# Patient Record
Sex: Female | Born: 1980 | Race: Black or African American | Hispanic: No | Marital: Single | Smoking: Never smoker
Health system: Southern US, Community
[De-identification: ages and names within clinical notes are randomized; demographics above are authoritative.]

## PROBLEM LIST (undated history)

## (undated) DIAGNOSIS — Z789 Other specified health status: Secondary | ICD-10-CM

## (undated) HISTORY — PX: DILATION AND CURETTAGE OF UTERUS: SHX78

---
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
## 2011-12-11 ENCOUNTER — Encounter (HOSPITAL_COMMUNITY): Payer: Self-pay | Admitting: *Deleted

## 2011-12-11 ENCOUNTER — Inpatient Hospital Stay (HOSPITAL_COMMUNITY)
Admission: AD | Admit: 2011-12-11 | Discharge: 2011-12-12 | Disposition: A | Discharge status: Home / Self Care | Payer: 59 | Source: Ambulatory Visit | Attending: Obstetrics & Gynecology | Admitting: Obstetrics & Gynecology

## 2011-12-11 DIAGNOSIS — K5900 Constipation, unspecified: Secondary | ICD-10-CM | POA: Insufficient documentation

## 2011-12-11 DIAGNOSIS — R109 Unspecified abdominal pain: Secondary | ICD-10-CM | POA: Insufficient documentation

## 2011-12-11 HISTORY — DX: Other specified health status: Z78.9

## 2011-12-11 LAB — URINALYSIS, ROUTINE W REFLEX MICROSCOPIC
Bilirubin Urine: NEGATIVE
Glucose, UA: NEGATIVE mg/dL
Ketones, ur: NEGATIVE mg/dL
Protein, ur: NEGATIVE mg/dL

## 2011-12-11 LAB — URINE MICROSCOPIC-ADD ON

## 2011-12-11 LAB — CBC
MCV: 91.7 fL (ref 78.0–100.0)
Platelets: 176 10*3/uL (ref 150–400)
RBC: 3.96 MIL/uL (ref 3.87–5.11)
RDW: 12.8 % (ref 11.5–15.5)
WBC: 6 10*3/uL (ref 4.0–10.5)

## 2011-12-11 NOTE — MAU Provider Note (Signed)
History     CSN: [id number]  Arrival date and time: 12/11/11 2235   First Provider Initiated Contact with Patient 12/11/11 2329      Chief Complaint  Patient presents with  . Abdominal Pain   HPI Gloria Fernandez 31 y.o. LMP 11-18-11  Had sudden onset of right upper abdominal pain tonight which radiates to right side below umbilicus.  No nausea or vomiting.  No lower abdominal pain.  Pregnancy test is negative.  Reports last BM was 3 months ago.  States that is normal for her.  OB History    Grav Para Term Preterm Abortions TAB SAB Ect Mult Living   4 3 1 2 1  0 1 0 0 3      Past Medical History  Diagnosis Date  . No pertinent past medical history     Past Surgical History  Procedure Date  . Leep 2000  . Dilation and curettage of uterus     Family History  Problem Relation Age of Onset  . Anesthesia problems Neg Hx   . Hypotension Neg Hx   . Malignant hyperthermia Neg Hx   . Pseudochol deficiency Neg Hx     History  Substance Use Topics  . Smoking status: Never Smoker   . Smokeless tobacco: Not on file  . Alcohol Use: Yes     occasionally    Allergies: No Known Allergies  Prescriptions prior to admission  Medication Sig Dispense Refill  . naproxen sodium (ANAPROX) 220 MG tablet Take 440 mg by mouth as needed. Used for pain in stomach area.        Review of Systems  Gastrointestinal: Positive for abdominal pain and constipation. Negative for nausea, vomiting and diarrhea.  Genitourinary: Negative for dysuria.       No vaginal bleeding   Physical Exam   Blood pressure 114/77, pulse 75, temperature 98.3 F (36.8 C), temperature source Oral, resp. rate 20, height 5\' 4"  (1.626 m), weight 143 lb 12.8 oz (65.227 kg), last menstrual period 11/18/2011.  Physical Exam  Nursing note and vitals reviewed. Constitutional: She is oriented to person, place, and time. She appears well-developed and well-nourished.  HENT:  Head: Normocephalic.  Eyes: EOM are normal.    Neck: Neck supple.  GI: Soft. Bowel sounds are normal. She exhibits no distension. There is tenderness. There is no rebound and no guarding.       Pain in RUQ with palpation  Musculoskeletal: Normal range of motion.  Neurological: She is alert and oriented to person, place, and time.  Skin: Skin is warm and dry.  Psychiatric: She has a normal mood and affect.    MAU Course  Procedures Results for orders placed during the hospital encounter of 12/11/11 (from the past 24 hour(s))  URINALYSIS, ROUTINE W REFLEX MICROSCOPIC     Status: Abnormal   Collection Time   12/11/11 10:50 PM      Component Value Range   Color, Urine YELLOW  YELLOW    APPearance CLEAR  CLEAR    Specific Gravity, Urine 1.010  1.005 - 1.030    pH 7.5  5.0 - 8.0    Glucose, UA NEGATIVE  NEGATIVE (mg/dL)   Hgb urine dipstick NEGATIVE  NEGATIVE    Bilirubin Urine NEGATIVE  NEGATIVE    Ketones, ur NEGATIVE  NEGATIVE (mg/dL)   Protein, ur NEGATIVE  NEGATIVE (mg/dL)   Urobilinogen, UA 1.0  0.0 - 1.0 (mg/dL)   Nitrite NEGATIVE  NEGATIVE    Leukocytes, UA  SMALL (*) NEGATIVE   URINE MICROSCOPIC-ADD ON     Status: Abnormal   Collection Time   12/11/11 10:50 PM      Component Value Range   Squamous Epithelial / LPF FEW (*) RARE    WBC, UA 3-6  <3 (WBC/hpf)   RBC / HPF 0-2  <3 (RBC/hpf)   Bacteria, UA FEW (*) RARE    Urine-Other MUCOUS PRESENT    POCT PREGNANCY, URINE     Status: Normal   Collection Time   12/11/11 10:56 PM      Component Value Range   Preg Test, Ur NEGATIVE  NEGATIVE   CBC     Status: Normal   Collection Time   12/11/11 11:45 PM      Component Value Range   WBC 6.0  4.0 - 10.5 (K/uL)   RBC 3.96  3.87 - 5.11 (MIL/uL)   Hemoglobin 12.2  12.0 - 15.0 (g/dL)   HCT 16.1  09.6 - 04.5 (%)   MCV 91.7  78.0 - 100.0 (fL)   MCH 30.8  26.0 - 34.0 (pg)   MCHC 33.6  30.0 - 36.0 (g/dL)   RDW 40.9  81.1 - 91.4 (%)   Platelets 176  150 - 400 (K/uL)  COMPREHENSIVE METABOLIC PANEL     Status: Normal   Collection  Time   12/11/11 11:45 PM      Component Value Range   Sodium 136  135 - 145 (mEq/L)   Potassium 3.5  3.5 - 5.1 (mEq/L)   Chloride 103  96 - 112 (mEq/L)   CO2 25  19 - 32 (mEq/L)   Glucose, Bld 88  70 - 99 (mg/dL)   BUN 10  6 - 23 (mg/dL)   Creatinine, Ser 7.82  0.50 - 1.10 (mg/dL)   Calcium 8.7  8.4 - 95.6 (mg/dL)   Total Protein 6.8  6.0 - 8.3 (g/dL)   Albumin 3.6  3.5 - 5.2 (g/dL)   AST 21  0 - 37 (U/L)   ALT 13  0 - 35 (U/L)   Alkaline Phosphatase 65  39 - 117 (U/L)   Total Bilirubin 0.6  0.3 - 1.2 (mg/dL)   GFR calc non Af Amer >90  >90 (mL/min)   GFR calc Af Amer >90  >90 (mL/min)    MDM With rest in bed, pain has decreased.  Discussed at length with client the importance of having a BM at least twice a week minimum.  She continues to affirm that she has not had a BM in 3 months.  Assessment and Plan  Constipation Abdominal pain  Plan Drink at least 8 8-oz glasses of water every day. Take over the counter Miralax by the package directions. Eat a high fiber diet. No fried foods over the weekend. Follow up with your doctor if the pain continues so you can be evaluated further.  Lumen Brinlee 12/11/2011, 11:34 PM

## 2011-12-11 NOTE — MAU Note (Signed)
"  I was sitting at my computer and started having sharp pain in R side. Constant pain. Then I got real hot and sat in front of a fan. Sometimes I hurt like this when I eat certain foods. It's never been this bad." Ate hamburger and rice at [facility]

## 2011-12-12 LAB — COMPREHENSIVE METABOLIC PANEL
ALT: 13 U/L (ref 0–35)
AST: 21 U/L (ref 0–37)
Albumin: 3.6 g/dL (ref 3.5–5.2)
Alkaline Phosphatase: 65 U/L (ref 39–117)
CO2: 25 mEq/L (ref 19–32)
Chloride: 103 mEq/L (ref 96–112)
GFR calc non Af Amer: >90 mL/min (ref >90)
Potassium: 3.5 mEq/L (ref 3.5–5.1)
Sodium: 136 mEq/L (ref 135–145)
Total Bilirubin: 0.6 mg/dL (ref 0.3–1.2)

## 2011-12-12 MED ORDER — IBUPROFEN 600 MG PO TABS
600.0000 mg | ORAL_TABLET | Freq: Once | ORAL | Status: AC
  Administered 2011-12-12: 600 mg via ORAL
  Filled 2011-12-12: qty 1

## 2011-12-12 NOTE — Progress Notes (Signed)
Written and verbal d/c instructions given and understanding voiced. 

## 2011-12-12 NOTE — Discharge Instructions (Signed)
Drink at least 8 8-oz glasses of water every day. Take over the counter Miralax by the package directions. Eat a high fiber diet. No fried foods over the weekend. Follow up with your doctor if the pain continues so you can be evaluated further.

## 2012-07-12 ENCOUNTER — Other Ambulatory Visit: Payer: Self-pay | Admitting: Obstetrics

## 2012-07-12 DIAGNOSIS — N644 Mastodynia: Secondary | ICD-10-CM

## 2012-07-19 ENCOUNTER — Other Ambulatory Visit: Payer: Self-pay | Admitting: Obstetrics

## 2012-07-19 ENCOUNTER — Ambulatory Visit
Admission: RE | Admit: 2012-07-19 | Discharge: 2012-07-19 | Disposition: A | Discharge status: Home / Self Care | Payer: 59 | Source: Ambulatory Visit | Attending: Obstetrics | Admitting: Obstetrics

## 2012-07-19 DIAGNOSIS — N644 Mastodynia: Secondary | ICD-10-CM

## 2012-07-19 IMAGING — MG MM DIGITAL DIAGNOSTIC BILAT
6 series · 6 of 6 positions shown · non-contrast
Comparison: No previous examinations.

CLINICAL DATA: Focal tenderness within the upper-outer quadrant
left breast.

DIGITAL DIAGNOSTIC BILATERAL MAMMOGRAM WITH CAD AND LEFT BREAST
ULTRASOUND:

[R CC (1 of 2)]
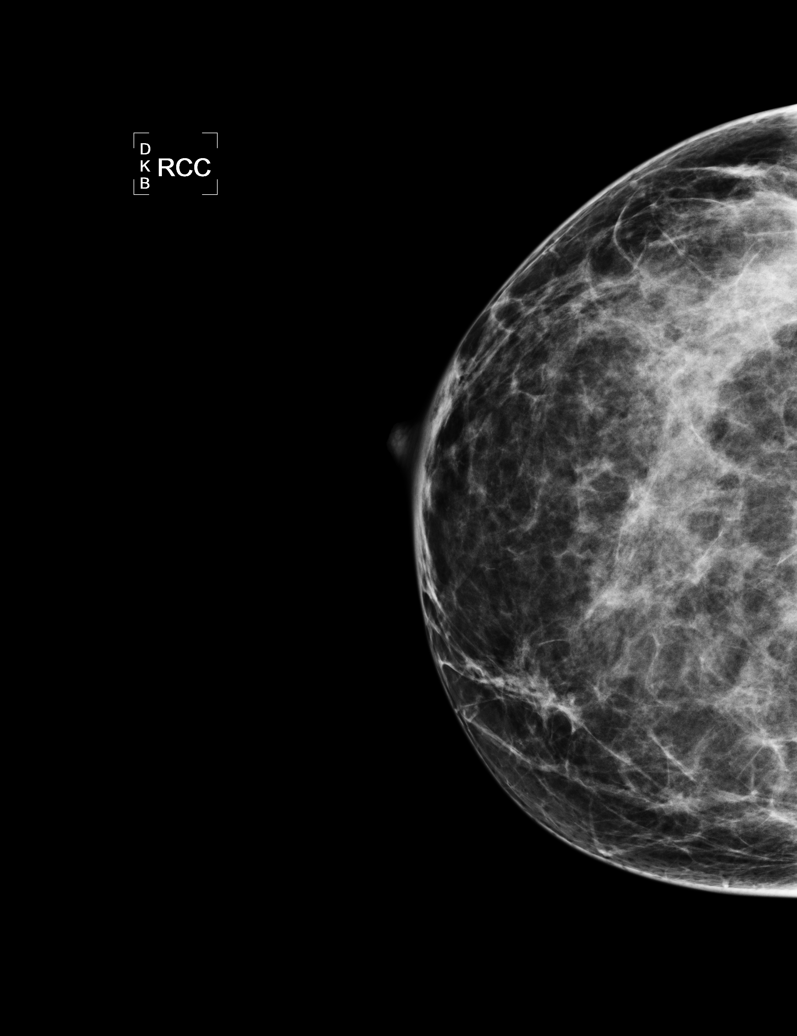

[L CC]
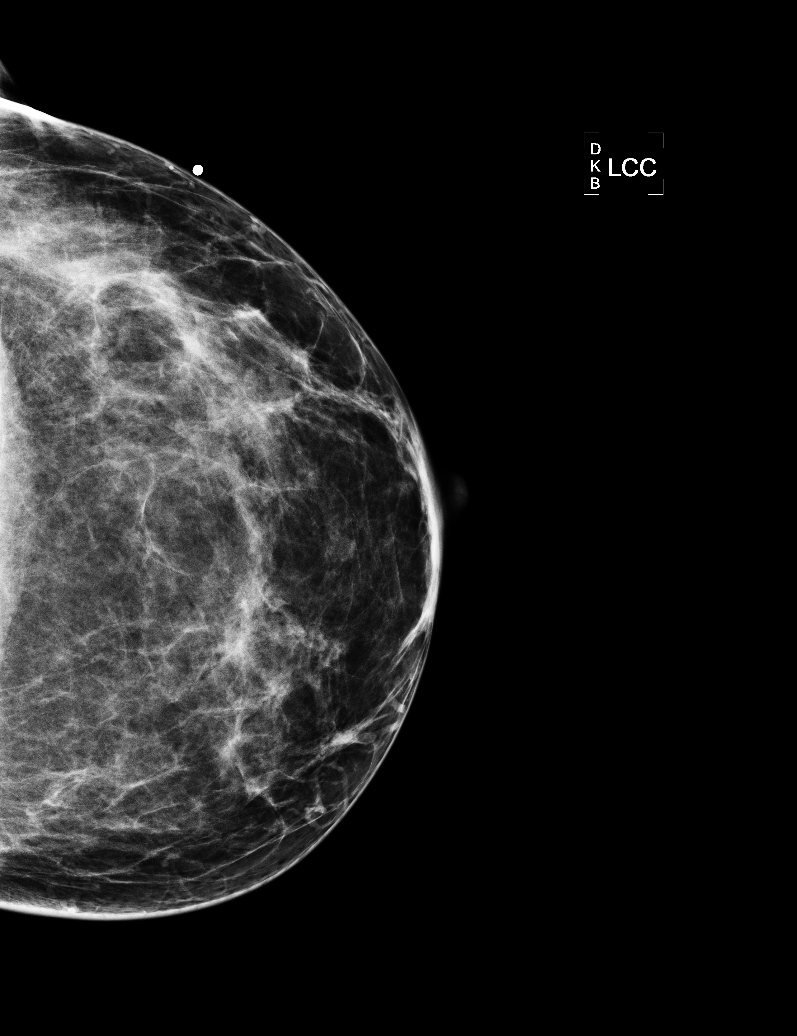

[L MLO]
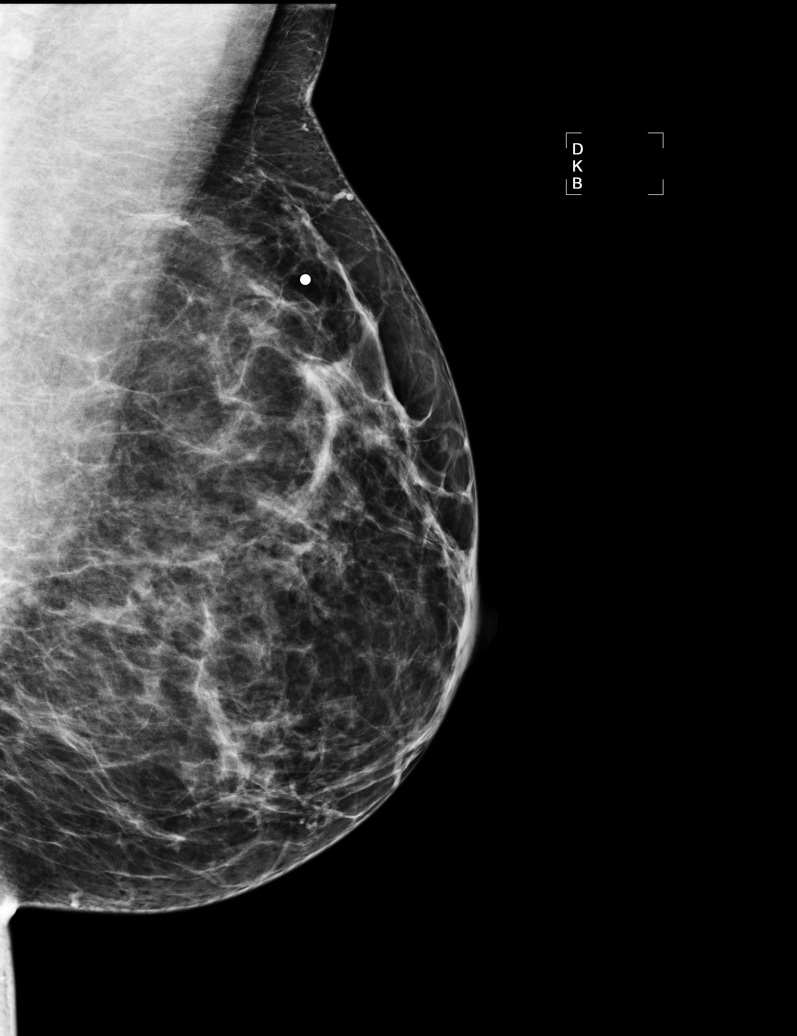

[R MLO]
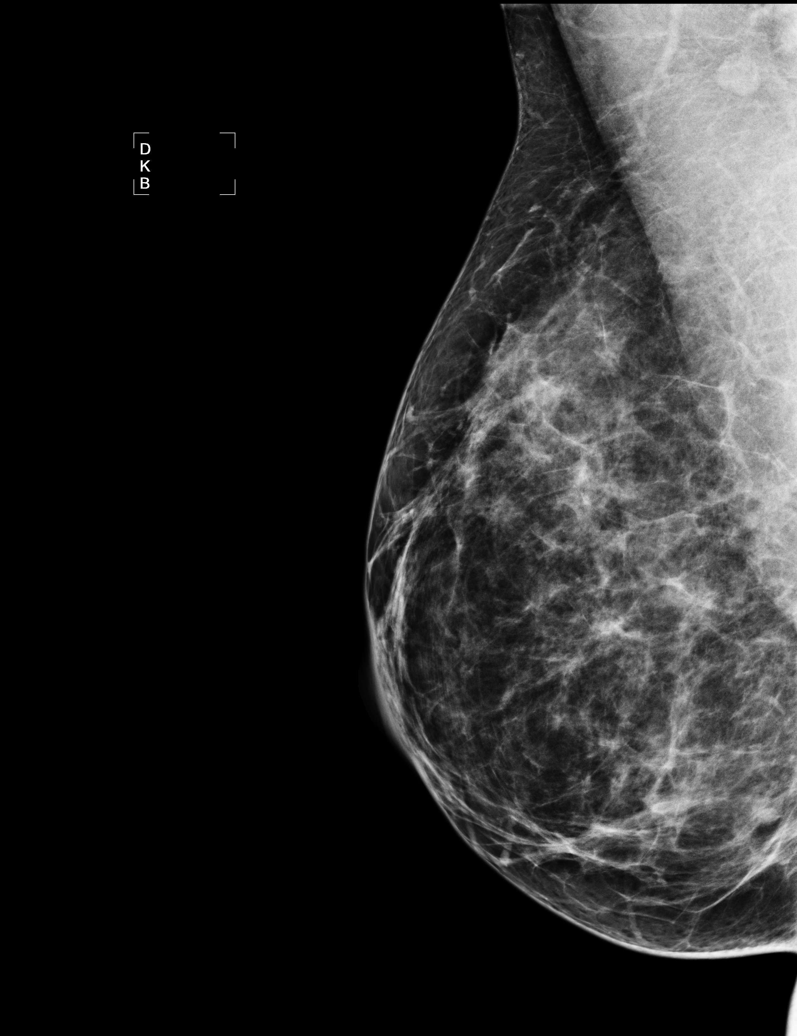

[L TAN]
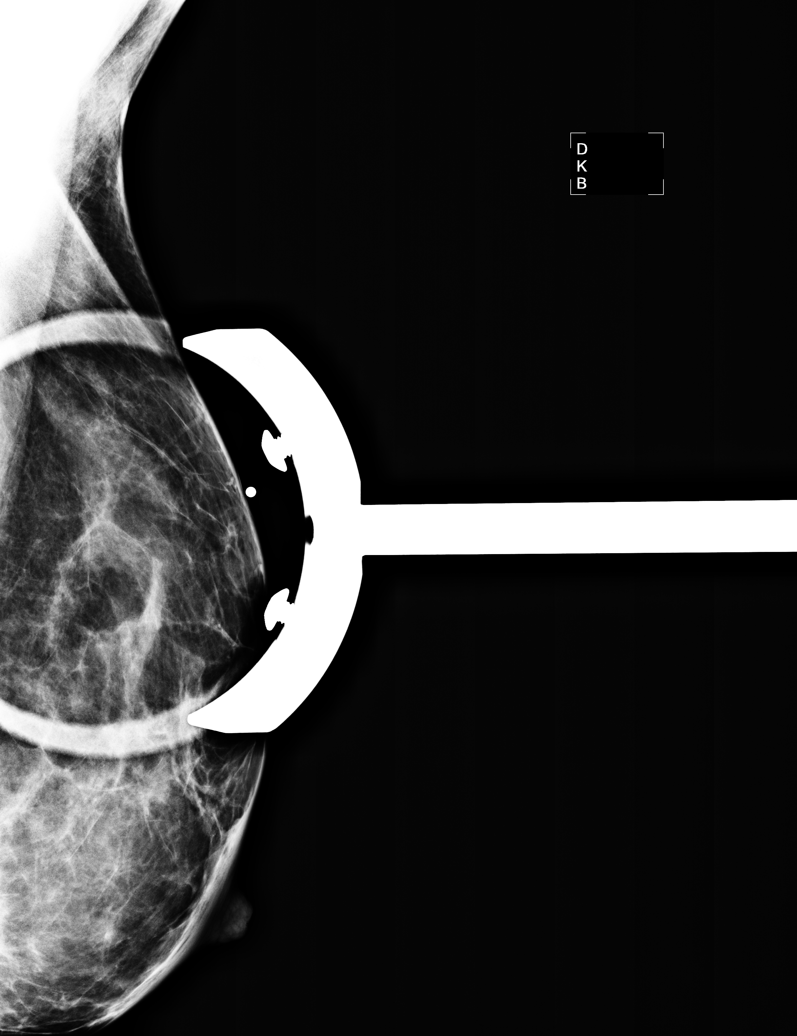

[R CC (2 of 2)]
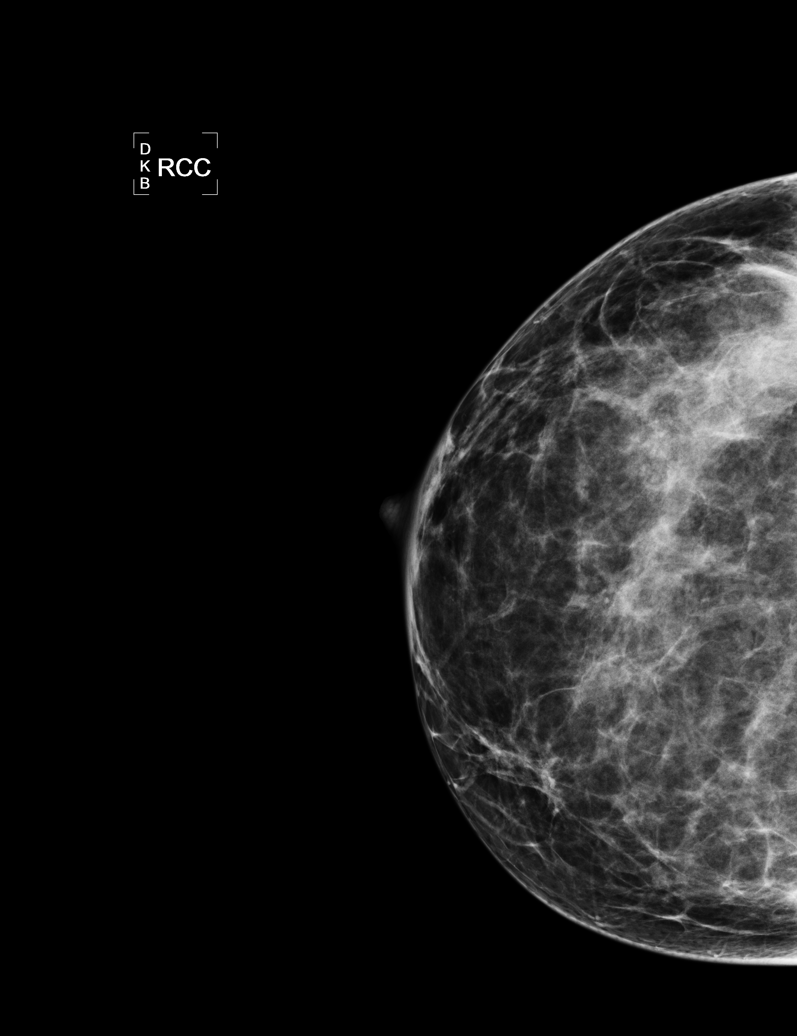

[6 of 6 positions shown; findings below may reference images not displayed]

FINDINGS: Breast Density: 3: The breast tissue is heterogeneously dense..

There is no mass, distortion, or worrisome calcification within
either breast.  In the area patient's focal tenderness there is
normal-appearing fibroglandular tissue.

Mammographic images were processed with CAD.

On physical exam, there is no discrete palpable abnormality within
the upper-outer quadrant of the left breast.

Ultrasound is performed, showing normal-appearing fibroglandular
tissue.
IMPRESSION: No findings worrisome for malignancy.  Breast self-examination was
reviewed with the patient.

RECOMMENDATION:
Screening mammography at age 39.

I have discussed the findings and recommendations with the patient.
Results were also provided in writing at the conclusion of the
visit.

BI-RADS CATEGORY 1:  Negative.

## 2013-02-20 ENCOUNTER — Ambulatory Visit: Payer: Self-pay | Admitting: Obstetrics

## 2013-04-28 ENCOUNTER — Ambulatory Visit (INDEPENDENT_AMBULATORY_CARE_PROVIDER_SITE_OTHER): Payer: Managed Care, Other (non HMO) | Admitting: Advanced Practice Midwife

## 2013-04-28 ENCOUNTER — Encounter: Payer: Self-pay | Admitting: Advanced Practice Midwife

## 2013-04-28 VITALS — BP 121/76 | HR 71 | Temp 98.3°F | Ht 65.0 in | Wt 141.2 lb

## 2013-04-28 DIAGNOSIS — Z01419 Encounter for gynecological examination (general) (routine) without abnormal findings: Secondary | ICD-10-CM | POA: Insufficient documentation

## 2013-04-28 DIAGNOSIS — Z8249 Family history of ischemic heart disease and other diseases of the circulatory system: Secondary | ICD-10-CM | POA: Insufficient documentation

## 2013-04-28 DIAGNOSIS — Z8342 Family history of familial hypercholesterolemia: Secondary | ICD-10-CM

## 2013-04-28 DIAGNOSIS — Z87898 Personal history of other specified conditions: Secondary | ICD-10-CM

## 2013-04-28 LAB — CBC WITH DIFFERENTIAL/PLATELET
Basophils Relative: 1 % (ref 0–1)
Eosinophils Absolute: 0.1 10*3/uL (ref 0.0–0.7)
Eosinophils Relative: 2 % (ref 0–5)
Lymphs Abs: 1.5 10*3/uL (ref 0.7–4.0)
MCH: 24.8 pg — ABNORMAL LOW (ref 26.0–34.0)
MCHC: 31.9 g/dL (ref 30.0–36.0)
MCV: 77.8 fL — ABNORMAL LOW (ref 78.0–100.0)
Monocytes Relative: 7 % (ref 3–12)
Neutrophils Relative %: 52 % (ref 43–77)
Platelets: 290 10*3/uL (ref 150–400)

## 2013-04-28 LAB — COMPREHENSIVE METABOLIC PANEL
Alkaline Phosphatase: 66 U/L (ref 39–117)
CO2: 27 mEq/L (ref 19–32)
Creat: 0.72 mg/dL (ref 0.50–1.10)
Glucose, Bld: 80 mg/dL (ref 70–99)
Sodium: 137 mEq/L (ref 135–145)
Total Bilirubin: 0.7 mg/dL (ref 0.3–1.2)
Total Protein: 7.5 g/dL (ref 6.0–8.3)

## 2013-04-28 LAB — CHOLESTEROL, TOTAL: Cholesterol: 144 mg/dL (ref 0–200)

## 2013-04-28 LAB — HDL CHOLESTEROL: HDL: 54 mg/dL (ref >39)

## 2013-04-28 LAB — TSH: TSH: 1.011 u[IU]/mL (ref 0.350–4.500)

## 2013-04-28 LAB — HEPATITIS B SURFACE ANTIGEN: Hepatitis B Surface Ag: NEGATIVE

## 2013-04-28 LAB — HIV ANTIBODY (ROUTINE TESTING W REFLEX): HIV: NONREACTIVE

## 2013-04-28 NOTE — Progress Notes (Signed)
. Subjective:     Gloria Fernandez is a 32 y.o. female here for a routine exam.  No current complaints.  Personal health questionnaire reviewed: yes. Patient having break through spoting. Denies other symptoms. Declines contraception for control of breakthrough bleeding.   Patient remains monogamous but is unsure if her partner is, would like STI screening.    Gynecologic History Patient's last menstrual period was 04/20/2013. Contraception: none Last  11/2011. Results were: normal.  Hx of LEEP. Last mammogram: 2013. Results were: normal  Obstetric History OB History  Gravida Para Term Preterm AB SAB TAB Ectopic Multiple Living  4 3 1 2 1 1  0 0 0 3    # Outcome Date GA Lbr Len/2nd Weight Sex Delivery Anes PTL Lv  4 SAB           3 PRE      SVD     2 PRE      SVD     1 TRM      SVD          The following portions of the patient's history were reviewed and updated as appropriate: allergies, current medications, past family history, past medical history, past social history, past surgical history and problem list.  Review of Systems A comprehensive review of systems was negative.    Objective:    BP 121/76  Pulse 71  Temp(Src) 98.3 F (36.8 C) (Oral)  Ht 5\' 5"  (1.651 m)  Wt 141 lb 3.2 oz (64.048 kg)  BMI 23.5 kg/m2  LMP 04/20/2013  General Appearance:    Alert, cooperative, no distress, appears stated age  Head:    Normocephalic, without obvious abnormality, atraumatic  Eyes:    PERRL, conjunctiva/corneas clear, EOM's intact, fundi    benign, both eyes  Ears:    Normal TM's and external ear canals, both ears  Nose:   Nares normal, septum midline, mucosa normal, no drainage    or sinus tenderness  Throat:   Lips, mucosa, and tongue normal; teeth and gums normal  Neck:   Supple, symmetrical, trachea midline, no adenopathy;    thyroid:  no enlargement/tenderness/nodules; no carotid   bruit or JVD  Back:     Symmetric, no curvature, ROM normal, no CVA tenderness  Lungs:      Clear to auscultation bilaterally, respirations unlabored  Chest Wall:    No tenderness or deformity   Heart:    Regular rate and rhythm, S1 and S2 normal, no murmur, rub   or gallop  Breast Exam:    No tenderness, masses, or nipple abnormality  Abdomen:     Soft, non-tender, bowel sounds active all four quadrants,    no masses, no organomegaly  Genitalia:    Normal female without lesion, discharge or tenderness     Extremities:   Extremities normal, atraumatic, no cyanosis or edema  Pulses:   2+ and symmetric all extremities  Skin:   Skin color, texture, turgor normal, no rashes or lesions  Lymph nodes:   Cervical, supraclavicular, and axillary nodes normal  Neurologic:   CNII-XII intact, normal strength, sensation and reflexes    throughout      Assessment:    Healthy female exam.  Patient Active Problem List   Diagnosis Date Noted  . Well woman exam with routine gynecological exam 04/28/2013  . H/O abnormal Pap smear H/O LEEP 04/28/2013  . Family history of cardiovascular disease 04/28/2013      Plan:    Education reviewed: calcium  supplements, depression evaluation, low fat, low cholesterol diet, safe sex/STD prevention, self breast exams, skin cancer screening and weight bearing exercise. Contraception: none. Follow up in: PRN .Marland Kitchen   Discussed with patient that since she is not preventing pregnancy she should be taking a PNV or folic acid supplement. Wellness lab panel today, if WNL, repeat in 5 years.   40 min spent with patient greater than 80% spent in counseling and coordination of care.   Amy Wilson Singer CNM

## 2013-05-01 LAB — PAP IG W/ RFLX HPV ASCU

## 2013-05-21 ENCOUNTER — Telehealth: Payer: Self-pay | Admitting: *Deleted

## 2013-05-21 NOTE — Telephone Encounter (Signed)
Call placed to patient no answer. A detailed voice message was left informing patient per Amy instructions. A voice message was left advising patient to call office if any questions. 

## 2013-05-21 NOTE — Telephone Encounter (Signed)
Message copied by Glendell Docker on Sun May 21, 2013 12:27 PM ------      Message from: Fowler, Virginia H      Created: Tue May 02, 2013  5:40 PM       Please call patient and notify of mild anemia. Patient can increase dietary sources of iron, oatmeal, beans, redmeats, green leafy vegetables. She may also take FeSO4 325 mg daily.             Amy Wilson Singer CNM      ----- Message -----         From: [facility]         Sent: 04/28/2013  10:10 PM           To: Amy Dessa Phi, CNM                   ------

## 2013-09-06 ENCOUNTER — Other Ambulatory Visit: Payer: Self-pay | Admitting: *Deleted

## 2013-09-06 DIAGNOSIS — Z309 Encounter for contraceptive management, unspecified: Secondary | ICD-10-CM

## 2013-09-06 MED ORDER — MEDROXYPROGESTERONE ACETATE 150 MG/ML IM SUSP: 150.0000 mg | INTRAMUSCULAR | Status: DC

## 2013-09-07 ENCOUNTER — Ambulatory Visit: Payer: Managed Care, Other (non HMO)

## 2013-09-08 ENCOUNTER — Ambulatory Visit (INDEPENDENT_AMBULATORY_CARE_PROVIDER_SITE_OTHER): Payer: BC Managed Care – PPO | Admitting: *Deleted

## 2013-09-08 VITALS — BP 116/78 | HR 81 | Temp 98.2°F | Ht 65.0 in | Wt 134.0 lb

## 2013-09-08 DIAGNOSIS — Z3049 Encounter for surveillance of other contraceptives: Secondary | ICD-10-CM

## 2013-09-08 MED ORDER — MEDROXYPROGESTERONE ACETATE 150 MG/ML IM SUSP
150.0000 mg | Freq: Once | INTRAMUSCULAR | Status: AC
  Administered 2013-09-08: 150 mg via INTRAMUSCULAR

## 2013-09-08 NOTE — Progress Notes (Signed)
Patient in office today for depo shot.

## 2013-09-29 ENCOUNTER — Encounter: Payer: Self-pay | Admitting: Obstetrics

## 2013-11-16 ENCOUNTER — Other Ambulatory Visit: Payer: Self-pay | Admitting: Advanced Practice Midwife

## 2013-11-16 ENCOUNTER — Encounter: Payer: Self-pay | Admitting: Advanced Practice Midwife

## 2013-11-30 ENCOUNTER — Ambulatory Visit (INDEPENDENT_AMBULATORY_CARE_PROVIDER_SITE_OTHER): Payer: BC Managed Care – PPO | Admitting: *Deleted

## 2013-11-30 VITALS — BP 123/80 | HR 87 | Temp 98.4°F | Ht 65.0 in | Wt 139.0 lb

## 2013-11-30 DIAGNOSIS — Z309 Encounter for contraceptive management, unspecified: Secondary | ICD-10-CM

## 2013-11-30 DIAGNOSIS — IMO0001 Reserved for inherently not codable concepts without codable children: Secondary | ICD-10-CM

## 2013-11-30 MED ORDER — MEDROXYPROGESTERONE ACETATE 150 MG/ML IM SUSP
150.0000 mg | INTRAMUSCULAR | Status: AC
  Administered 2013-11-30 – 2014-08-09 (×4): 150 mg via INTRAMUSCULAR

## 2013-11-30 NOTE — Progress Notes (Signed)
Patient is in the office today for her DEPO Injection. Patient is on time for her Injection. Patient states she is not having any problems with her DEPO. Injection given in Right Upper Outer Quadrant. Patient tolerated well. Patient advised that if she started to have any problems to call the office. Patient notified to return to the office on February 21, 2014 for her next DEPO Injection.

## 2014-02-21 ENCOUNTER — Ambulatory Visit (INDEPENDENT_AMBULATORY_CARE_PROVIDER_SITE_OTHER): Payer: BC Managed Care – PPO | Admitting: *Deleted

## 2014-02-21 VITALS — BP 124/80 | HR 71 | Temp 98.3°F | Ht 65.0 in | Wt 152.0 lb

## 2014-02-21 DIAGNOSIS — Z3042 Encounter for surveillance of injectable contraceptive: Secondary | ICD-10-CM

## 2014-02-21 DIAGNOSIS — Z3049 Encounter for surveillance of other contraceptives: Secondary | ICD-10-CM

## 2014-02-21 NOTE — Progress Notes (Signed)
Patient is in the office today for her DEPO Injection. Patient is on time for her Injection. Patient denies any concerns today. Injection given in Left Upper Outer Quadrant. Patient tolerated well. Patient advised that not having a cycle is common and that she will hopefully not have anymore cycles while on the DEPO. Patient also advised that we have noticed an increase in weight gain over the past year and that she should just be mindful of when she eats and what she eats because the hormones in the DEPO can cause you to be hungry, so she just wants to be mindful and eat healthy foods. Patient voiced understanding. Patient advised to come back on May 15, 2014 for her next DEPO Injection  BP 124/80  Pulse 71  Temp(Src) 98.3 F (36.8 C)  Ht 5\' 5"  (1.651 m)  Wt 152 lb (68.947 kg)  BMI 25.29 kg/m2  Administrations This Visit   medroxyPROGESTERone (DEPO-PROVERA) injection 150 mg   Administered Action Dose Route Administered By   02/21/2014 Given 150 mg Intramuscular Odessa FlemingKristina M Daveion Robar, LPN

## 2014-05-01 ENCOUNTER — Ambulatory Visit (INDEPENDENT_AMBULATORY_CARE_PROVIDER_SITE_OTHER): Payer: BC Managed Care – PPO | Admitting: Obstetrics

## 2014-05-01 ENCOUNTER — Other Ambulatory Visit: Payer: Self-pay | Admitting: Obstetrics

## 2014-05-01 ENCOUNTER — Ambulatory Visit: Payer: BC Managed Care – PPO | Admitting: Advanced Practice Midwife

## 2014-05-01 ENCOUNTER — Encounter: Payer: Self-pay | Admitting: Obstetrics

## 2014-05-01 VITALS — BP 129/86 | HR 60 | Temp 99.0°F | Ht 65.0 in | Wt 150.0 lb

## 2014-05-01 DIAGNOSIS — Z01419 Encounter for gynecological examination (general) (routine) without abnormal findings: Secondary | ICD-10-CM

## 2014-05-01 DIAGNOSIS — Z113 Encounter for screening for infections with a predominantly sexual mode of transmission: Secondary | ICD-10-CM

## 2014-05-01 DIAGNOSIS — N760 Acute vaginitis: Secondary | ICD-10-CM

## 2014-05-01 MED ORDER — METRONIDAZOLE 500 MG PO TABS: 500.0000 mg | ORAL_TABLET | Freq: Two times a day (BID) | ORAL | Status: DC

## 2014-05-01 NOTE — Progress Notes (Signed)
Subjective:     Gloria Fernandez is a 33 y.o. female here for a routine exam.  Current complaints: Vaginal discharge, malodorous.    Personal health questionnaire:  Is patient Ashkenazi Jewish, have a family history of breast and/or ovarian cancer: no Is there a family history of uterine cancer diagnosed at age < 17, gastrointestinal cancer, urinary tract cancer, family member who is a Personnel officer syndrome-associated carrier: no Is the patient overweight and hypertensive, family history of diabetes, personal history of gestational diabetes or PCOS: no Is patient over 37, have PCOS,  family history of premature CHD under age 36, diabetes, smoke, have hypertension or peripheral artery disease:  no At any time, has a partner hit, kicked or otherwise hurt or frightened you?: no Over the past 2 weeks, have you felt down, depressed or hopeless?: no Over the past 2 weeks, have you felt little interest or pleasure in doing things?:no   Gynecologic History No LMP recorded. Patient has had an injection. Contraception: Depo-Provera injections Last Pap: 2014. Results were: normal Last mammogram: n/a. Results were: n/a  Obstetric History OB History  Gravida Para Term Preterm AB SAB TAB Ectopic Multiple Living  0 0 0 3    # Outcome Date GA Lbr Len/2nd Weight Sex Delivery Anes PTL Lv  4 SAB 2013        N  3 TRM 02/15/06 [redacted]w[redacted]d  7 lb (3.175 kg) M SVD None  Y  2 PRE 04/10/98 [redacted]w[redacted]d  5 lb 4 oz (2.381 kg) F SVD None  Y  1 TRM 04/17/97 [redacted]w[redacted]d  6 lb 6 oz (2.892 kg) M SVD None  Y      Past Medical History  Diagnosis Date  . No pertinent past medical history     Past Surgical History  Procedure Laterality Date  . Leep  2000  . Dilation and curettage of uterus      Current outpatient prescriptions:medroxyPROGESTERone (DEPO-PROVERA) 150 MG/ML injection, Inject 1 mL (150 mg total) into the muscle every 3 (three) months., Disp: 1 mL, Rfl: 3;  metroNIDAZOLE (FLAGYL) 500 MG tablet, Take 1 tablet (500 mg  total) by mouth 2 (two) times daily., Disp: 14 tablet, Rfl: 2 Current facility-administered medications:medroxyPROGESTERone (DEPO-PROVERA) injection 150 mg, 150 mg, Intramuscular, Q90 days, Brock Bad, MD, 150 mg at 02/21/14 1624 No Known Allergies  History  Substance Use Topics  . Smoking status: Never Smoker   . Smokeless tobacco: Not on file  . Alcohol Use: Yes     Comment: Occass.     Family History  Problem Relation Age of Onset  . Anesthesia problems Neg Hx   . Hypotension Neg Hx   . Malignant hyperthermia Neg Hx   . Pseudochol deficiency Neg Hx       Review of Systems  Constitutional: negative for fatigue and weight loss Respiratory: negative for cough and wheezing Cardiovascular: negative for chest pain, fatigue and palpitations Gastrointestinal: negative for abdominal pain and change in bowel habits Musculoskeletal:negative for myalgias Neurological: negative for gait problems and tremors Behavioral/Psych: negative for abusive relationship, depression Endocrine: negative for temperature intolerance   Genitourinary:negative for abnormal menstrual periods, genital lesions, hot flashes, sexual problems and vaginal discharge Integument/breast: negative for breast lump, breast tenderness, nipple discharge and skin lesion(s)    Objective:       BP 129/86  Pulse 60  Temp(Src) 99 F (37.2 C)  Ht  (1.651 m)  Wt 150 lb (68.04 kg)  BMI  24.96 kg/m2 General:   alert  Skin:   no rash or abnormalities  Lungs:   clear to auscultation bilaterally  Heart:   regular rate and rhythm, S1, S2 normal, no murmur, click, rub or gallop  Breasts:   normal without suspicious masses, skin or nipple changes or axillary nodes  Abdomen:  normal findings: no organomegaly, soft, non-tender and no hernia  Pelvis:  External genitalia: normal general appearance Urinary system: urethral meatus normal and bladder without fullness, nontender Vaginal: vaginal discharge,  malodorous Cervix: normal appearance Adnexa: normal bimanual exam Uterus: anteverted and non-tender, normal size   Lab Review Urine pregnancy test Labs reviewed yes Radiologic studies reviewed no    Assessment:    Healthy female exam.   BV  Surveillance of Depo Provera.  Doing well.   Plan:    Education reviewed: low fat, low cholesterol diet, safe sex/STD prevention, self breast exams, weight bearing exercise and prevention and management of BV. Contraception: Depo-Provera injections. Follow up in: 1 year.   Meds ordered this encounter  Medications  . metroNIDAZOLE (FLAGYL) 500 MG tablet    Sig: Take 1 tablet (500 mg total) by mouth 2 (two) times daily.    Dispense:  14 tablet    Refill:  2   Orders Placed This Encounter  Procedures  . WET PREP BY MOLECULAR PROBE  . GC/Chlamydia Probe Amp  . HIV antibody  . Hepatitis B surface antigen  . RPR  . Hepatitis C antibody

## 2014-05-02 ENCOUNTER — Other Ambulatory Visit: Payer: Self-pay | Admitting: Obstetrics

## 2014-05-02 LAB — WET PREP BY MOLECULAR PROBE
Candida species: NEGATIVE
Gardnerella vaginalis: POSITIVE — AB
Trichomonas vaginosis: NEGATIVE

## 2014-05-02 LAB — GC/CHLAMYDIA PROBE AMP
CT Probe RNA: NEGATIVE
GC Probe RNA: NEGATIVE

## 2014-05-02 LAB — HEPATITIS C ANTIBODY: HCV Ab: NEGATIVE

## 2014-05-02 LAB — RPR

## 2014-05-02 LAB — HEPATITIS B SURFACE ANTIGEN: Hepatitis B Surface Ag: NEGATIVE

## 2014-05-02 LAB — HIV ANTIBODY (ROUTINE TESTING W REFLEX): HIV 1&2 Ab, 4th Generation: NONREACTIVE

## 2014-05-03 LAB — PAP IG AND HPV HIGH-RISK: HPV DNA HIGH RISK: NOT DETECTED

## 2014-05-15 ENCOUNTER — Ambulatory Visit: Payer: BC Managed Care – PPO | Admitting: *Deleted

## 2014-05-15 ENCOUNTER — Ambulatory Visit (INDEPENDENT_AMBULATORY_CARE_PROVIDER_SITE_OTHER): Payer: BC Managed Care – PPO | Admitting: *Deleted

## 2014-05-15 VITALS — BP 135/92 | HR 61 | Temp 98.3°F | Ht 65.0 in | Wt 152.0 lb

## 2014-05-15 DIAGNOSIS — Z3042 Encounter for surveillance of injectable contraceptive: Secondary | ICD-10-CM

## 2014-05-15 NOTE — Progress Notes (Signed)
Patient in office for a Depo Injection. Patient is on time for her injection.  Patient tolerated injection well.  Patient due for her next injection 08-06-14.   BP 135/92  Pulse 61  Temp(Src) 98.3 F (36.8 C)  Ht 5\' 5"  (1.651 m)  Wt 152 lb (68.947 kg)  BMI 25.29 kg/m2  Administrations This Visit   medroxyPROGESTERone (DEPO-PROVERA) injection 150 mg   Administered Action Dose Route Administered By   05/15/2014 Given 150 mg Intramuscular Shelda PalAndrea Lynn Schuyler Behan, LPN

## 2014-08-02 ENCOUNTER — Other Ambulatory Visit: Payer: Self-pay | Admitting: Obstetrics

## 2014-08-06 ENCOUNTER — Other Ambulatory Visit: Payer: Self-pay | Admitting: *Deleted

## 2014-08-06 ENCOUNTER — Ambulatory Visit: Payer: BC Managed Care – PPO

## 2014-08-06 ENCOUNTER — Other Ambulatory Visit: Payer: Self-pay | Admitting: Obstetrics

## 2014-08-09 ENCOUNTER — Ambulatory Visit (INDEPENDENT_AMBULATORY_CARE_PROVIDER_SITE_OTHER): Payer: BC Managed Care – PPO | Admitting: *Deleted

## 2014-08-09 VITALS — BP 136/91 | HR 78 | Temp 98.7°F | Ht 65.0 in | Wt 163.0 lb

## 2014-08-09 DIAGNOSIS — Z3042 Encounter for surveillance of injectable contraceptive: Secondary | ICD-10-CM

## 2014-08-09 NOTE — Progress Notes (Signed)
Patient in office for Depo Injection. Patient is on time for her injection.  Patient due for her next injection. 10-31-14.  BP 136/91 mmHg  Pulse 78  Temp(Src) 98.7 F (37.1 C)  Ht 5\' 5"  (1.651 m)  Wt 163 lb (73.936 kg)  BMI 27.12 kg/m2  Administrations This Visit    medroxyPROGESTERone (DEPO-PROVERA) injection 150 mg    Administered Action Dose Route Administered By         08/09/2014 Given 150 mg Intramuscular Shelda PalAndrea Lynn Tylia Ewell, LPN

## 2014-10-31 ENCOUNTER — Ambulatory Visit (INDEPENDENT_AMBULATORY_CARE_PROVIDER_SITE_OTHER): Payer: BC Managed Care – PPO | Admitting: *Deleted

## 2014-10-31 VITALS — BP 134/87 | HR 65 | Temp 99.0°F | Ht 65.0 in | Wt 166.0 lb

## 2014-10-31 DIAGNOSIS — Z3042 Encounter for surveillance of injectable contraceptive: Secondary | ICD-10-CM

## 2014-10-31 MED ORDER — MEDROXYPROGESTERONE ACETATE 150 MG/ML IM SUSP
150.0000 mg | INTRAMUSCULAR | Status: AC
  Administered 2014-10-31 – 2015-07-08 (×4): 150 mg via INTRAMUSCULAR

## 2014-10-31 NOTE — Progress Notes (Signed)
Patient is in the office today for her DEPO Injection. Patient is on time for her Injection. Injection given in Left Deltoid, patient tolerated well. Patient denies any concerns today. Patient notified to make an appointment with the front for January 22, 2015 for her next Depo Injection. Patient voiced understanding.   BP 134/87 mmHg  Pulse 65  Temp(Src) 99 F (37.2 C)  Ht 5\' 5"  (1.651 m)  Wt 166 lb (75.297 kg)  BMI 27.62 kg/m2  Administrations This Visit    medroxyPROGESTERone (DEPO-PROVERA) injection 150 mg    Admin Date Action Dose Route Administered By         10/31/2014 Given 150 mg Intramuscular Odessa FlemingKristina M Shelaine Frie, LPN

## 2015-01-22 ENCOUNTER — Ambulatory Visit: Payer: BC Managed Care – PPO

## 2015-01-23 ENCOUNTER — Ambulatory Visit (INDEPENDENT_AMBULATORY_CARE_PROVIDER_SITE_OTHER): Payer: BC Managed Care – PPO | Admitting: *Deleted

## 2015-01-23 VITALS — BP 119/81 | HR 72 | Temp 99.2°F | Ht 65.0 in | Wt 176.0 lb

## 2015-01-23 DIAGNOSIS — Z3042 Encounter for surveillance of injectable contraceptive: Secondary | ICD-10-CM

## 2015-01-23 NOTE — Progress Notes (Signed)
Patient in office today for a Depo Injection. Patient is on time for her injection.  Patient tolerated injection well.  Patient due for next injection on April 16, 2015.  BP 119/81 mmHg  Pulse 72  Temp(Src) 99.2 F (37.3 C)  Ht 5\' 5"  (1.651 m)  Wt 176 lb (79.833 kg)  BMI 29.29 kg/m2  Administrations This Visit    medroxyPROGESTERone (DEPO-PROVERA) injection 150 mg    Admin Date Action Dose Route Administered By         01/23/2015 Given 150 mg Intramuscular Henriette Combs, LPN

## 2015-04-16 ENCOUNTER — Ambulatory Visit (INDEPENDENT_AMBULATORY_CARE_PROVIDER_SITE_OTHER): Payer: BC Managed Care – PPO | Admitting: Obstetrics

## 2015-04-16 ENCOUNTER — Encounter: Payer: Self-pay | Admitting: Obstetrics

## 2015-04-16 ENCOUNTER — Ambulatory Visit: Payer: BC Managed Care – PPO

## 2015-04-16 VITALS — BP 125/83 | HR 73 | Temp 98.6°F | Ht 65.0 in | Wt 173.0 lb

## 2015-04-16 DIAGNOSIS — Z124 Encounter for screening for malignant neoplasm of cervix: Secondary | ICD-10-CM | POA: Diagnosis not present

## 2015-04-16 DIAGNOSIS — Z3042 Encounter for surveillance of injectable contraceptive: Secondary | ICD-10-CM | POA: Diagnosis not present

## 2015-04-16 DIAGNOSIS — Z01419 Encounter for gynecological examination (general) (routine) without abnormal findings: Secondary | ICD-10-CM

## 2015-04-16 MED ORDER — MEDROXYPROGESTERONE ACETATE 150 MG/ML IM SUSP: INTRAMUSCULAR | Status: DC

## 2015-04-16 NOTE — Addendum Note (Signed)
Addended by: Marya Landry D on: 04/16/2015 11:59 AM   Modules accepted: Orders

## 2015-04-16 NOTE — Progress Notes (Signed)
Subjective:        Gloria Fernandez is a 34 y.o. female here for a routine exam.  Current complaints: none.    Personal health questionnaire:  Is patient Ashkenazi Jewish, have a family history of breast and/or ovarian cancer: no Is there a family history of uterine cancer diagnosed at age < 33, gastrointestinal cancer, urinary tract cancer, family member who is a Personnel officer syndrome-associated carrier: no Is the patient overweight and hypertensive, family history of diabetes, personal history of gestational diabetes, preeclampsia or PCOS: no Is patient over 39, have PCOS,  family history of premature CHD under age 59, diabetes, smoke, have hypertension or peripheral artery disease:  no At any time, has a partner hit, kicked or otherwise hurt or frightened you?: no Over the past 2 weeks, have you felt down, depressed or hopeless?: no Over the past 2 weeks, have you felt little interest or pleasure in doing things?:no   Gynecologic History No LMP recorded. Patient has had an injection. Contraception: Depo-Provera injections Last Pap: 2015. Results were: normal Last mammogram: n/a. Results were: n/a  Obstetric History OB History  Gravida Para Term Preterm AB SAB TAB Ectopic Multiple Living  4 3 2 1 1 1  0 0 0 3    # Outcome Date GA Lbr Len/2nd Weight Sex Delivery Anes PTL Lv  4 SAB 2013        N  3 Term 02/15/06 [redacted]w[redacted]d  7 lb (3.175 kg) M Vag-Spont None  Y  2 Preterm 04/10/98 [redacted]w[redacted]d  5 lb 4 oz (2.381 kg) F Vag-Spont None  Y  1 Term 04/17/97 [redacted]w[redacted]d  6 lb 6 oz (2.892 kg) M Vag-Spont None  Y      Past Medical History  Diagnosis Date  . No pertinent past medical history     Past Surgical History  Procedure Laterality Date  . Leep  2000  . Dilation and curettage of uterus       Current outpatient prescriptions:  .  medroxyPROGESTERone (DEPO-PROVERA) 150 MG/ML injection, INJECT 1 ML (150 MG TOTAL) INTO THE MUSCLE EVERY 3 (THREE) MONTHS., Disp: 1 mL, Rfl: 3  Current  facility-administered medications:  .  medroxyPROGESTERone (DEPO-PROVERA) injection 150 mg, 150 mg, Intramuscular, Q90 days, Brock Bad, MD, 150 mg at 04/16/15 1610 No Known Allergies  Social History  Substance Use Topics  . Smoking status: Never Smoker   . Smokeless tobacco: Not on file  . Alcohol Use: 0.0 oz/week    0 Standard drinks or equivalent per week     Comment: Occass.     Family History  Problem Relation Age of Onset  . Anesthesia problems Neg Hx   . Hypotension Neg Hx   . Malignant hyperthermia Neg Hx   . Pseudochol deficiency Neg Hx       Review of Systems  Constitutional: negative for fatigue and weight loss Respiratory: negative for cough and wheezing Cardiovascular: negative for chest pain, fatigue and palpitations Gastrointestinal: negative for abdominal pain and change in bowel habits Musculoskeletal:negative for myalgias Neurological: negative for gait problems and tremors Behavioral/Psych: negative for abusive relationship, depression Endocrine: negative for temperature intolerance   Genitourinary:negative for abnormal menstrual periods, genital lesions, hot flashes, sexual problems and vaginal discharge Integument/breast: negative for breast lump, breast tenderness, nipple discharge and skin lesion(s)    Objective:       BP 125/83 mmHg  Pulse 73  Temp(Src) 98.6 F (37 C)  Ht 5\' 5"  (1.651 m)  Wt 173 lb (78.472  kg)  BMI 28.79 kg/m2 General:   alert  Skin:   no rash or abnormalities  Lungs:   clear to auscultation bilaterally  Heart:   regular rate and rhythm, S1, S2 normal, no murmur, click, rub or gallop  Breasts:   normal without suspicious masses, skin or nipple changes or axillary nodes  Abdomen:  normal findings: no organomegaly, soft, non-tender and no hernia  Pelvis:  External genitalia: normal general appearance Urinary system: urethral meatus normal and bladder without fullness, nontender Vaginal: normal without tenderness,  induration or masses Cervix: normal appearance Adnexa: normal bimanual exam Uterus: anteverted and non-tender, normal size   Lab Review Urine pregnancy test Labs reviewed yes Radiologic studies reviewed no  Assessment:    Healthy female exam.    Plan:    Education reviewed: calcium supplements, low fat, low cholesterol diet, safe sex/STD prevention, self breast exams and weight bearing exercise. Contraception: Depo-Provera injections. Follow up in: 1 year.   Meds ordered this encounter  Medications  . medroxyPROGESTERone (DEPO-PROVERA) 150 MG/ML injection    Sig: INJECT 1 ML (150 MG TOTAL) INTO THE MUSCLE EVERY 3 (THREE) MONTHS.    Dispense:  1 mL    Refill:  3   No orders of the defined types were placed in this encounter.

## 2015-04-19 LAB — PAP, TP IMAGING W/ HPV RNA, RFLX HPV TYPE 16,18/45: HPV mRNA, High Risk: DETECTED — AB

## 2015-04-20 ENCOUNTER — Other Ambulatory Visit: Payer: Self-pay | Admitting: Obstetrics

## 2015-04-20 DIAGNOSIS — N760 Acute vaginitis: Principal | ICD-10-CM

## 2015-04-20 DIAGNOSIS — B9689 Other specified bacterial agents as the cause of diseases classified elsewhere: Secondary | ICD-10-CM

## 2015-04-20 LAB — SURESWAB, VAGINOSIS/VAGINITIS PLUS
Atopobium vaginae: NOT DETECTED Log (cells/mL)
C. ALBICANS, DNA: NOT DETECTED
C. GLABRATA, DNA: NOT DETECTED
C. TRACHOMATIS RNA, TMA: NOT DETECTED
C. TROPICALIS, DNA: NOT DETECTED
C. parapsilosis, DNA: NOT DETECTED
GARDNERELLA VAGINALIS: 7.9 Log (cells/mL)
LACTOBACILLUS SPECIES: NOT DETECTED Log (cells/mL)
MEGASPHAERA SPECIES: NOT DETECTED Log (cells/mL)
N. gonorrhoeae RNA, TMA: NOT DETECTED
T. VAGINALIS RNA, QL TMA: NOT DETECTED

## 2015-04-20 MED ORDER — METRONIDAZOLE 500 MG PO TABS: 500.0000 mg | ORAL_TABLET | Freq: Two times a day (BID) | ORAL | Status: DC

## 2015-04-23 LAB — HPV TYPE 16 AND 18/45 RNA
HPV TYPE 16 RNA: NOT DETECTED
HPV Type 18/45 RNA: NOT DETECTED

## 2015-05-06 ENCOUNTER — Ambulatory Visit: Payer: BC Managed Care – PPO | Admitting: Obstetrics

## 2015-07-08 ENCOUNTER — Ambulatory Visit (INDEPENDENT_AMBULATORY_CARE_PROVIDER_SITE_OTHER): Payer: BC Managed Care – PPO | Admitting: *Deleted

## 2015-07-08 VITALS — BP 124/84 | HR 72 | Temp 99.1°F | Ht 65.0 in | Wt 181.0 lb

## 2015-07-08 DIAGNOSIS — Z3042 Encounter for surveillance of injectable contraceptive: Secondary | ICD-10-CM | POA: Diagnosis not present

## 2015-07-08 NOTE — Progress Notes (Signed)
Patient in office today for a Depo injection. Patient is on time for her injection. Patient tolerated injection well. Patient due for next injection Feb. 19, 2017.  BP 124/84 mmHg  Pulse 72  Temp(Src) 99.1 F (37.3 C)  Ht 5\' 5"  (1.651 m)  Wt 181 lb (82.101 kg)  BMI 30.12 kg/m2  Administrations This Visit    medroxyPROGESTERone (DEPO-PROVERA) injection 150 mg    Admin Date Action Dose Route Administered By         07/08/2015 Given 150 mg Intramuscular Henriette CombsAndrea L Ranessa Kosta, LPN

## 2015-09-29 ENCOUNTER — Other Ambulatory Visit: Payer: Self-pay | Admitting: Obstetrics

## 2015-09-30 ENCOUNTER — Ambulatory Visit: Payer: BC Managed Care – PPO

## 2015-10-09 ENCOUNTER — Other Ambulatory Visit (INDEPENDENT_AMBULATORY_CARE_PROVIDER_SITE_OTHER): Payer: BC Managed Care – PPO | Admitting: *Deleted

## 2015-10-09 VITALS — BP 127/79 | HR 70 | Wt 178.0 lb

## 2015-10-09 DIAGNOSIS — Z3202 Encounter for pregnancy test, result negative: Secondary | ICD-10-CM

## 2015-10-09 DIAGNOSIS — Z3042 Encounter for surveillance of injectable contraceptive: Secondary | ICD-10-CM

## 2015-10-09 LAB — POCT URINE PREGNANCY: PREG TEST UR: NEGATIVE

## 2015-10-09 NOTE — Progress Notes (Signed)
Pt is in office today for UPT to restart depo.  Pt was late for her last injection.  Pt states that she has not had any cycle or bleeding since last injection.  UPT in office today is Negative.  Pt was advised of restart policy. Pt advised to abstain for 2 weeks and return for repeat test and receive depo in negative results. Pt has been scheduled for 10-23-15 forn next visit.

## 2015-10-23 ENCOUNTER — Ambulatory Visit (INDEPENDENT_AMBULATORY_CARE_PROVIDER_SITE_OTHER): Payer: BC Managed Care – PPO | Admitting: *Deleted

## 2015-10-23 VITALS — BP 128/87 | HR 64 | Temp 98.7°F | Wt 179.0 lb

## 2015-10-23 DIAGNOSIS — Z3042 Encounter for surveillance of injectable contraceptive: Secondary | ICD-10-CM

## 2015-10-23 MED ORDER — MEDROXYPROGESTERONE ACETATE 150 MG/ML IM SUSP
150.0000 mg | INTRAMUSCULAR | Status: AC
  Administered 2015-10-23 – 2016-09-25 (×3): 150 mg via INTRAMUSCULAR

## 2015-10-23 NOTE — Progress Notes (Signed)
Patient in office today for a pregnancy test and a Depo injection. Patient states she last had intercourse several months ago. Her pregnancy test in office is negative.  Patient tolerated injection well. Patient due for next depo on January 14, 2016.   BP 128/87 mmHg  Pulse 64  Temp(Src) 98.7 F (37.1 C)  Wt 179 lb (81.194 kg)   Administrations This Visit    medroxyPROGESTERone (DEPO-PROVERA) injection 150 mg    Admin Date Action Dose Route Administered By         10/23/2015 Given 150 mg Intramuscular Henriette CombsAndrea L Hatton, LPN

## 2016-01-14 ENCOUNTER — Ambulatory Visit: Payer: BC Managed Care – PPO

## 2016-01-14 ENCOUNTER — Ambulatory Visit (INDEPENDENT_AMBULATORY_CARE_PROVIDER_SITE_OTHER): Payer: BC Managed Care – PPO | Admitting: *Deleted

## 2016-01-14 VITALS — BP 132/87 | HR 69 | Temp 99.2°F | Wt 181.0 lb

## 2016-01-14 DIAGNOSIS — Z3042 Encounter for surveillance of injectable contraceptive: Secondary | ICD-10-CM

## 2016-01-14 MED ORDER — MEDROXYPROGESTERONE ACETATE 150 MG/ML IM SUSP
150.0000 mg | Freq: Once | INTRAMUSCULAR | Status: AC
  Administered 2016-01-14: 150 mg via INTRAMUSCULAR

## 2016-01-14 NOTE — Progress Notes (Signed)
Patient tolerated Depo injection well in L. Arm RTO 8-28--17

## 2016-04-06 ENCOUNTER — Ambulatory Visit (INDEPENDENT_AMBULATORY_CARE_PROVIDER_SITE_OTHER): Payer: BC Managed Care – PPO | Admitting: *Deleted

## 2016-04-06 DIAGNOSIS — Z3042 Encounter for surveillance of injectable contraceptive: Secondary | ICD-10-CM | POA: Diagnosis not present

## 2016-04-06 MED ORDER — MEDROXYPROGESTERONE ACETATE 150 MG/ML IM SUSP
150.0000 mg | Freq: Once | INTRAMUSCULAR | Status: AC
  Administered 2016-04-06: 150 mg via INTRAMUSCULAR

## 2016-04-06 NOTE — Patient Instructions (Signed)
Patient advised to RTO 11/13-27/17 for next injection. She voiced understanding.

## 2016-04-16 ENCOUNTER — Encounter: Payer: Self-pay | Admitting: Obstetrics

## 2016-04-16 ENCOUNTER — Ambulatory Visit (INDEPENDENT_AMBULATORY_CARE_PROVIDER_SITE_OTHER): Payer: BC Managed Care – PPO | Admitting: Advanced Practice Midwife

## 2016-04-16 VITALS — BP 142/93 | HR 72 | Temp 99.1°F | Wt 181.8 lb

## 2016-04-16 DIAGNOSIS — Z01419 Encounter for gynecological examination (general) (routine) without abnormal findings: Secondary | ICD-10-CM | POA: Diagnosis not present

## 2016-04-16 DIAGNOSIS — R030 Elevated blood-pressure reading, without diagnosis of hypertension: Secondary | ICD-10-CM

## 2016-04-16 DIAGNOSIS — N898 Other specified noninflammatory disorders of vagina: Secondary | ICD-10-CM

## 2016-04-16 NOTE — Progress Notes (Signed)
Subjective:     Gloria Fernandez is a 35 y.o. female here for a routine exam.  Current complaints: None.    Gynecologic History No LMP recorded. Patient has had an injection. Contraception: Depo-Provera injections Last Pap: 2016. Results were: normal but positive for HR HPV Last mammogram: 2-3 years ago per pt. Results were: normal  Obstetric History OB History  Gravida Para Term Preterm AB Living  4 3 2 1 1 3   SAB TAB Ectopic Multiple Live Births  1 0 0 0 3    # Outcome Date GA Lbr Len/2nd Weight Sex Delivery Anes PTL Lv  4 SAB 2013        DEC  3 Term 02/15/06 [id number]  7 lb (3.175 kg) M Vag-Spont None  LIV  2 Preterm 04/10/98 1379w0d  5 lb 4 oz (2.381 kg) F Vag-Spont None  LIV  1 Term 04/17/97 [id number]  6 lb 6 oz (2.892 kg) M Vag-Spont None  LIV       The following portions of the patient's history were reviewed and updated as appropriate: allergies, current medications, past family history, past medical history, past social history, past surgical history and problem list.  Review of Systems A comprehensive review of systems was negative.    Objective:   BP (!) 142/93   Pulse 72   Temp 99.1 F (37.3 C)   Wt 181 lb 12.8 oz (82.5 kg)   BMI 30.25 kg/m    GENERAL: Well-developed, well-nourished female in no acute distress.  HEENT: Normocephalic, atraumatic. Sclerae anicteric.  NECK: Supple. Normal thyroid.  LUNGS: Clear to auscultation bilaterally.  HEART: Regular rate and rhythm. BREASTS: Symmetric in size. No masses, skin changes, nipple drainage, or lymphadenopathy. ABDOMEN: Soft, nontender, nondistended. No organomegaly. PELVIC: Normal external female genitalia. Vagina is pink and rugated.  Normal discharge. Normal cervix contour. Pap smear obtained. Uterus is normal in size. No adnexal mass or tenderness.  EXTREMITIES: No cyanosis, clubbing, or edema, 2+ distal pulses.   Assessment:     1. Well woman exam with routine gynecological exam --Pap sent to lab.  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  2. Vaginal discharge --No itching/burning.  White thin discharge without odor.  Nuswab collected.  3. Transient hypertension --No hx HTN per pt. Discussed need for f/u with primary care, recommend low sodium diet     Plan:    Education reviewed: low fat, low cholesterol diet, safe sex/STD prevention and self breast exams. Contraception: Depo-Provera injections. Next injection in 2 months

## 2016-04-22 LAB — PAP IG AND HPV HIGH-RISK
HPV, high-risk: POSITIVE — AB
PAP Smear Comment: 0

## 2016-04-22 LAB — NUSWAB VG+, CANDIDA 6SP
Atopobium vaginae: HIGH Score — AB
CANDIDA ALBICANS, NAA: NEGATIVE
CANDIDA GLABRATA, NAA: NEGATIVE
CANDIDA TROPICALIS, NAA: NEGATIVE
Candida krusei, NAA: NEGATIVE
Candida lusitaniae, NAA: NEGATIVE
Candida parapsilosis, NAA: NEGATIVE
Chlamydia trachomatis, NAA: NEGATIVE
Neisseria gonorrhoeae, NAA: NEGATIVE
Trich vag by NAA: NEGATIVE

## 2016-04-28 ENCOUNTER — Telehealth: Payer: Self-pay | Admitting: *Deleted

## 2016-04-28 NOTE — Telephone Encounter (Signed)
-----   Message from Hurshel PartyLisa A Leftwich-Kirby, CNM sent at 04/23/2016  9:02 PM EDT ----- Regarding: Pap result Pt has normal Pap result but still positive for HR HPV. She was positive last year also, which is why we repeated Pap. According to guidelines, she needs a colposcopy scheduled. Can we schedule and call to let her know?  Thank you.

## 2016-04-28 NOTE — Telephone Encounter (Signed)
Patient notified of her results- appointment made

## 2016-05-13 ENCOUNTER — Encounter: Payer: Self-pay | Admitting: Obstetrics

## 2016-05-13 ENCOUNTER — Ambulatory Visit (INDEPENDENT_AMBULATORY_CARE_PROVIDER_SITE_OTHER): Payer: BC Managed Care – PPO | Admitting: Obstetrics

## 2016-05-13 ENCOUNTER — Other Ambulatory Visit: Payer: Self-pay | Admitting: Obstetrics

## 2016-05-13 VITALS — BP 146/86 | HR 73 | Wt 179.0 lb

## 2016-05-13 DIAGNOSIS — Z01812 Encounter for preprocedural laboratory examination: Secondary | ICD-10-CM

## 2016-05-13 DIAGNOSIS — N898 Other specified noninflammatory disorders of vagina: Secondary | ICD-10-CM

## 2016-05-13 DIAGNOSIS — Z3202 Encounter for pregnancy test, result negative: Secondary | ICD-10-CM

## 2016-05-13 DIAGNOSIS — R87618 Other abnormal cytological findings on specimens from cervix uteri: Secondary | ICD-10-CM

## 2016-05-13 DIAGNOSIS — R8789 Other abnormal findings in specimens from female genital organs: Secondary | ICD-10-CM | POA: Diagnosis not present

## 2016-05-13 LAB — POCT URINE PREGNANCY: Preg Test, Ur: NEGATIVE

## 2016-05-13 MED ORDER — METRONIDAZOLE 500 MG PO TABS: 500.0000 mg | ORAL_TABLET | Freq: Two times a day (BID) | ORAL | 0 refills | Status: DC

## 2016-05-13 NOTE — Progress Notes (Signed)
Colposcopy Procedure Note  Indications: Pap smear 1 months ago showed: positive High Risk HPV, negative for dysplasia. The prior pap showed positive High Risk HPV, no Dysplasia.  Prior cervical/vaginal disease: HGSIL. Prior cervical treatment: LLETZ.  Procedure Details  The risks and benefits of the procedure and Written informed consent obtained.  A time-out was performed confirming the patient, procedure and allergy status  Speculum placed in vagina and excellent visualization of cervix achieved, cervix swabbed x 3 with acetic acid solution.  Findings: Cervix: no visible lesions, no mosaicism, no punctation and no abnormal vasculature; SCJ visualized 360 degrees without lesions, endocervical curettage performed, cervical biopsies taken at 6 and 12 o'clock, specimen labelled and sent to pathology and hemostasis achieved with silver nitrate.   Vaginal inspection: normal without visible lesions. Vulvar colposcopy: vulvar colposcopy not performed.   Physical Exam   Specimens: ECC and Cervical Biopsies  Complications: None.  Plan: Specimens labelled and sent to Pathology. Will base further treatment on Pathology findings. Post biopsy instructions given to patient. Return to discuss Pathology results in 2 weeks.

## 2016-05-18 ENCOUNTER — Other Ambulatory Visit: Payer: Self-pay | Admitting: Obstetrics

## 2016-05-27 ENCOUNTER — Encounter: Payer: Self-pay | Admitting: Obstetrics

## 2016-05-27 ENCOUNTER — Ambulatory Visit (INDEPENDENT_AMBULATORY_CARE_PROVIDER_SITE_OTHER): Payer: BC Managed Care – PPO | Admitting: Obstetrics

## 2016-05-27 VITALS — BP 122/88 | HR 64 | Temp 98.2°F | Wt 176.2 lb

## 2016-05-27 DIAGNOSIS — R87618 Other abnormal cytological findings on specimens from cervix uteri: Secondary | ICD-10-CM

## 2016-05-27 DIAGNOSIS — R8789 Other abnormal findings in specimens from female genital organs: Secondary | ICD-10-CM | POA: Diagnosis not present

## 2016-05-28 NOTE — Progress Notes (Signed)
Patient ID: Merdis Delayameka Kilbride, female   DOB: March 08, 1981, 35 y.o.   MRN: [id number]  Chief Complaint  Patient presents with  . Follow-up    2 weeks f/u for colpo    HPI Merdis Delayameka Nakajima is a 35 y.o. female.  Presents for results of colposcopy. HPI  Past Medical History:  Diagnosis Date  . No pertinent past medical history     Past Surgical History:  Procedure Laterality Date  . DILATION AND CURETTAGE OF UTERUS    . LEEP  2000    Family History  Problem Relation Age of Onset  . Anesthesia problems Neg Hx   . Hypotension Neg Hx   . Malignant hyperthermia Neg Hx   . Pseudochol deficiency Neg Hx     Social History Social History  Substance Use Topics  . Smoking status: Never Smoker  . Smokeless tobacco: Never Used  . Alcohol use 0.0 oz/week     Comment: Occass.     No Known Allergies  Current Outpatient Prescriptions  Medication Sig Dispense Refill  . medroxyPROGESTERone (DEPO-PROVERA) 150 MG/ML injection INJECT 1 ML (150 MG TOTAL) INTO THE MUSCLE EVERY 3 (THREE) MONTHS. 1 mL 3  . metroNIDAZOLE (FLAGYL) 500 MG tablet Take 1 tablet (500 mg total) by mouth 2 (two) times daily. 14 tablet 0   Current Facility-Administered Medications  Medication Dose Route Frequency Provider Last Rate Last Dose  . medroxyPROGESTERone (DEPO-PROVERA) injection 150 mg  150 mg Intramuscular Q90 days Brock Badharles A Alexsia Klindt, MD   150 mg at 10/23/15 [id number]    Review of Systems Review of Systems Constitutional: negative for fatigue and weight loss Respiratory: negative for cough and wheezing Cardiovascular: negative for chest pain, fatigue and palpitations Gastrointestinal: negative for abdominal pain and change in bowel habits Genitourinary:negative Integument/breast: negative for nipple discharge Musculoskeletal:negative for myalgias Neurological: negative for gait problems and tremors Behavioral/Psych: negative for abusive relationship, depression Endocrine: negative for temperature intolerance      Blood pressure 122/88, pulse 64, temperature 98.2 F (36.8 C), temperature source Oral, weight 176 lb 3.2 oz (79.9 kg).  Physical Exam Physical Exam:  Deferred  50% of 10 min visit spent on counseling and coordination of care.   Data Reviewed Pathology  Assessment     Positive High Risk HPV    Plan    Repeat pap 1 year  No orders of the defined types were placed in this encounter.  No orders of the defined types were placed in this encounter.

## 2016-06-23 ENCOUNTER — Ambulatory Visit: Payer: Self-pay

## 2016-07-06 ENCOUNTER — Telehealth: Payer: Self-pay | Admitting: *Deleted

## 2016-07-06 ENCOUNTER — Encounter: Payer: Self-pay | Admitting: *Deleted

## 2016-07-06 ENCOUNTER — Other Ambulatory Visit: Payer: Self-pay | Admitting: Obstetrics

## 2016-07-06 ENCOUNTER — Ambulatory Visit (INDEPENDENT_AMBULATORY_CARE_PROVIDER_SITE_OTHER): Payer: BC Managed Care – PPO | Admitting: *Deleted

## 2016-07-06 DIAGNOSIS — Z3042 Encounter for surveillance of injectable contraceptive: Secondary | ICD-10-CM

## 2016-07-06 MED ORDER — MEDROXYPROGESTERONE ACETATE 150 MG/ML IM SUSP: INTRAMUSCULAR | 3 refills | Status: AC

## 2016-07-06 NOTE — Telephone Encounter (Signed)
PT called in and wanted her DEPO prescription refilled because it had expired and she has an appt today for her injection. Preferred pharmacy is [facility] on [address]

## 2016-09-25 ENCOUNTER — Ambulatory Visit (INDEPENDENT_AMBULATORY_CARE_PROVIDER_SITE_OTHER): Payer: BC Managed Care – PPO

## 2016-09-25 VITALS — BP 121/83 | HR 62 | Wt 180.7 lb

## 2016-09-25 DIAGNOSIS — Z3202 Encounter for pregnancy test, result negative: Secondary | ICD-10-CM | POA: Diagnosis not present

## 2016-09-25 DIAGNOSIS — Z3042 Encounter for surveillance of injectable contraceptive: Secondary | ICD-10-CM

## 2016-09-25 LAB — POCT URINE PREGNANCY: Preg Test, Ur: NEGATIVE

## 2016-09-25 NOTE — Progress Notes (Signed)
Patient is here for DEPO Injection, UPT- NEGATIVE. Shot given in Left deltoid.  Administrations This Visit    medroxyPROGESTERone (DEPO-PROVERA) injection 150 mg    Admin Date 09/25/2016 Action Given Dose 150 mg Route Intramuscular Administered By Maretta Beesarol J McGlashan, RMA

## 2016-09-28 ENCOUNTER — Ambulatory Visit: Payer: BC Managed Care – PPO

## 2016-12-23 ENCOUNTER — Ambulatory Visit (INDEPENDENT_AMBULATORY_CARE_PROVIDER_SITE_OTHER): Payer: BC Managed Care – PPO

## 2016-12-23 ENCOUNTER — Encounter: Payer: Self-pay | Admitting: *Deleted

## 2016-12-23 VITALS — BP 133/86 | HR 81 | Wt 182.6 lb

## 2016-12-23 DIAGNOSIS — Z3042 Encounter for surveillance of injectable contraceptive: Secondary | ICD-10-CM

## 2016-12-23 MED ORDER — MEDROXYPROGESTERONE ACETATE 150 MG/ML IM SUSP
150.0000 mg | Freq: Once | INTRAMUSCULAR | Status: AC
  Administered 2016-12-23: 150 mg via INTRAMUSCULAR

## 2016-12-23 NOTE — Progress Notes (Signed)
Patient presents for DEPO. Given in Right Deltoid. Tolerated well Next DEPO 8/1-15/2018  Administrations This Visit    medroxyPROGESTERone (DEPO-PROVERA) injection 150 mg    Admin Date 12/23/2016 Action Given Dose 150 mg Route Intramuscular Administered By Maretta BeesMcGlashan, Shamarra Warda J, RMA

## 2017-03-10 ENCOUNTER — Ambulatory Visit: Payer: BC Managed Care – PPO

## 2017-05-17 ENCOUNTER — Ambulatory Visit: Payer: BC Managed Care – PPO | Admitting: Obstetrics

## 2019-03-29 DIAGNOSIS — Z803 Family history of malignant neoplasm of breast: Secondary | ICD-10-CM | POA: Diagnosis not present

## 2019-03-29 DIAGNOSIS — Z309 Encounter for contraceptive management, unspecified: Secondary | ICD-10-CM | POA: Diagnosis not present

## 2019-03-29 DIAGNOSIS — Z6826 Body mass index (BMI) 26.0-26.9, adult: Secondary | ICD-10-CM | POA: Diagnosis not present

## 2019-03-29 DIAGNOSIS — E042 Nontoxic multinodular goiter: Secondary | ICD-10-CM | POA: Diagnosis not present

## 2019-04-24 DIAGNOSIS — Z3042 Encounter for surveillance of injectable contraceptive: Secondary | ICD-10-CM | POA: Diagnosis not present

## 2019-05-04 DIAGNOSIS — E041 Nontoxic single thyroid nodule: Secondary | ICD-10-CM | POA: Diagnosis not present

## 2019-05-09 ENCOUNTER — Other Ambulatory Visit: Payer: Self-pay | Admitting: Surgery

## 2019-05-09 DIAGNOSIS — E041 Nontoxic single thyroid nodule: Secondary | ICD-10-CM

## 2019-05-11 DIAGNOSIS — Z20828 Contact with and (suspected) exposure to other viral communicable diseases: Secondary | ICD-10-CM | POA: Diagnosis not present

## 2019-05-11 DIAGNOSIS — Z03818 Encounter for observation for suspected exposure to other biological agents ruled out: Secondary | ICD-10-CM | POA: Diagnosis not present

## 2019-05-12 ENCOUNTER — Ambulatory Visit
Admission: EM | Admit: 2019-05-12 | Discharge: 2019-05-12 | Disposition: A | Discharge status: Home / Self Care | Payer: BLUE CROSS/BLUE SHIELD | Attending: Physician Assistant | Admitting: Physician Assistant

## 2019-05-12 ENCOUNTER — Other Ambulatory Visit: Payer: Self-pay

## 2019-05-12 ENCOUNTER — Encounter: Payer: Self-pay | Admitting: Physician Assistant

## 2019-05-12 DIAGNOSIS — M545 Low back pain, unspecified: Secondary | ICD-10-CM

## 2019-05-12 DIAGNOSIS — R1013 Epigastric pain: Secondary | ICD-10-CM

## 2019-05-12 DIAGNOSIS — K5900 Constipation, unspecified: Secondary | ICD-10-CM | POA: Diagnosis not present

## 2019-05-12 DIAGNOSIS — M546 Pain in thoracic spine: Secondary | ICD-10-CM

## 2019-05-12 MED ORDER — KETOROLAC TROMETHAMINE 30 MG/ML IJ SOLN
30.0000 mg | Freq: Once | INTRAMUSCULAR | Status: AC
  Administered 2019-05-12: 15:00:00 30 mg via INTRAVENOUS

## 2019-05-12 MED ORDER — POLYETHYLENE GLYCOL 3350 17 G PO PACK: 17.0000 g | PACK | Freq: Every day | ORAL | 0 refills | Status: AC

## 2019-05-12 MED ORDER — METHOCARBAMOL 500 MG PO TABS: 500.0000 mg | ORAL_TABLET | Freq: Two times a day (BID) | ORAL | 0 refills | Status: AC

## 2019-05-12 NOTE — ED Provider Notes (Signed)
[facility]    CSN: [id number] Arrival date & time: 05/12/19  1431      History   Chief Complaint No chief complaint on file.   HPI Gloria Fernandez is a 38 y.o. female.   38 year old female comes in for 2 day history of bilateral upper and lower extremity back pain. Denies falls/injury/trauma. States pain is constant, aching in sensation, worse with movement. Denies radiation of pain. Denies neck pain, loss of grip strength. Denies saddle anesthesia, loss of bladder/bowel control. Also has intermittent epigastric pain without nausea/vomiting. Does report constipation with last BM 5 days ago. Denies searing back pain, dizziness, weakness, syncope. Denies chest pain, shortness of breath. Denies urinary symptoms such as frequency, dysuria, hematuria. Has not tried anything for the symptoms.   When asked about medicines to help symptoms, patient states "I'm taking mucinex as I have some sinus symptoms". She initially denies any URI symptoms. Had COVID testing yesterday, results pending. Denies fever, chills.      Past Medical History:  Diagnosis Date  . No pertinent past medical history     Patient Active Problem List   Diagnosis Date Noted  . H/O abnormal Pap smear H/O LEEP 04/28/2013  . Family history of cardiovascular disease 04/28/2013    Past Surgical History:  Procedure Laterality Date  . DILATION AND CURETTAGE OF UTERUS    . LEEP  2000    OB History    Gravida  4   Para  3   Term  2   Preterm  1   AB  1   Living  3     SAB  1   TAB  0   Ectopic  0   Multiple  0   Live Births  3            Home Medications    Prior to Admission medications   Medication Sig Start Date End Date Taking? Authorizing Provider  medroxyPROGESTERone (DEPO-PROVERA) 150 MG/ML injection INJECT 1 ML (150 MG TOTAL) INTO THE MUSCLE EVERY 3 (THREE) MONTHS. 07/06/16   Brock BadHarper, Charles A, MD  methocarbamol (ROBAXIN) 500 MG tablet Take 1 tablet (500 mg total) by  mouth 2 (two) times daily. 05/12/19   Cathie HoopsYu, Amy V, PA-C  polyethylene glycol (MIRALAX) 17 g packet Take 17 g by mouth daily. 05/12/19   Belinda FisherYu, Amy V, PA-C    Family History Family History  Problem Relation Age of Onset  . Anesthesia problems Neg Hx   . Hypotension Neg Hx   . Malignant hyperthermia Neg Hx   . Pseudochol deficiency Neg Hx     Social History Social History   Tobacco Use  . Smoking status: Never Smoker  . Smokeless tobacco: Never Used  Substance Use Topics  . Alcohol use: Yes    Alcohol/week: 0.0 standard drinks    Comment: Occass.   . Drug use: No     Allergies   Patient has no known allergies.   Review of Systems Review of Systems  Reason unable to perform ROS: See HPI as above.     Physical Exam Triage Vital Signs ED Triage Vitals  Enc Vitals Group     BP      Pulse      Resp      Temp      Temp src      SpO2      Weight      Height      Head Circumference  Peak Flow      Pain Score      Pain Loc      Pain Edu?      Excl. in [place]?    No data found.  Updated Vital Signs BP (!) 126/92   Pulse 80   Temp 98.2 F (36.8 C) (Oral)   SpO2 98%   Physical Exam Constitutional:      General: She is not in acute distress.    Appearance: She is well-developed. She is not diaphoretic.  HENT:     Head: Normocephalic and atraumatic.  Eyes:     Conjunctiva/sclera: Conjunctivae normal.     Pupils: Pupils are equal, round, and reactive to light.  Cardiovascular:     Rate and Rhythm: Normal rate and regular rhythm.     Heart sounds: Normal heart sounds. No murmur. No friction rub. No gallop.   Pulmonary:     Effort: Pulmonary effort is normal. No accessory muscle usage or respiratory distress.     Breath sounds: Normal breath sounds. No stridor. No decreased breath sounds, wheezing, rhonchi or rales.  Abdominal:     General: Bowel sounds are normal.     Palpations: Abdomen is soft. There is no hepatomegaly or splenomegaly.     Tenderness: There  is abdominal tenderness in the epigastric area. There is no right CVA tenderness, left CVA tenderness, guarding or rebound. Negative signs include Murphy's sign.  Musculoskeletal:     Comments: No spinous process tenderness on palpation. Diffuse tenderness to palpation of bilateral thoracic and lumbar back. Full ROM of back, hips, shoulder. Full range of motion. Strength normal and equal bilaterally. Sensation intact and equal bilaterally.  Radial pulses 2+ and equal bilaterally. Capillary refill less than 2 seconds.   Skin:    General: Skin is warm and dry.  Neurological:     Mental Status: She is alert and oriented to person, place, and time.    UC Treatments / Results  Labs (all labs ordered are listed, but only abnormal results are displayed) Labs Reviewed - No data to display  EKG   Radiology No results found.  Procedures Procedures (including critical care time)  Medications Ordered in UC Medications  ketorolac (TORADOL) 30 MG/ML injection 30 mg (30 mg Intravenous Given 05/12/19 1500)    Initial Impression / Assessment and Plan / UC Course  I have reviewed the triage vital signs and the nursing notes.  Pertinent labs & imaging results that were available during my care of the patient were reviewed by me and considered in my medical decision making (see chart for details).    Full PPE was worn after patient admitted to [facility] [place] testing. ?bodyaches given diffuse back pain. Patient to quarantine until testing results returned. Lower suspicion of dissection given stable vitals, reproducible pain, in no obvious distress. Will provide toradol injection in office today. Robaxin as needed for back pain. miralax for constipation. Return precautions given.  Final Clinical Impressions(s) / UC Diagnoses   Final diagnoses:  Acute bilateral thoracic back pain  Acute bilateral low back pain without sciatica  Constipation, unspecified constipation type  Abdominal pain, epigastric   ED  Prescriptions    Medication Sig Dispense Auth. Provider   methocarbamol (ROBAXIN) 500 MG tablet Take 1 tablet (500 mg total) by mouth 2 (two) times daily. 20 tablet Yu, Amy V, PA-C   polyethylene glycol (MIRALAX) 17 g packet Take 17 g by mouth daily. 14 each Arturo Morton     PDMP  not reviewed this encounter.   Belinda Fisher, PA-C 05/12/19 1715

## 2019-05-12 NOTE — Discharge Instructions (Addendum)
Toradol injection in office today. You can take tylenol/motrin for back pain. Robaxin as directed, this can make you drowsy, so do not take if you are going to drive, operate heavy machinery, or make important decisions. Ice/heat compresses as needed. Start miralax as directed for constipation. Keep hydrated, urine should be clear to pale yellow in color. Follow up with PCP if symptoms not improving. If experience numbness/tingling of the inner thighs, loss of bladder or bowel control, go to the emergency department for evaluation.

## 2019-05-15 ENCOUNTER — Ambulatory Visit
Admission: RE | Admit: 2019-05-15 | Discharge: 2019-05-15 | Disposition: A | Discharge status: Home / Self Care | Payer: BLUE CROSS/BLUE SHIELD | Source: Ambulatory Visit | Attending: Surgery | Admitting: Surgery

## 2019-05-15 DIAGNOSIS — E041 Nontoxic single thyroid nodule: Secondary | ICD-10-CM

## 2019-05-15 IMAGING — US US THYROID
1 series · 13 of 25 positions shown · non-contrast
Comparison: None.

CLINICAL DATA: Thyroid nodule

EXAM:
THYROID ULTRASOUND
TECHNIQUE: Ultrasound examination of the thyroid gland and adjacent soft
tissues was performed.

[Series 1: us thyroid · 0.04mm/px · 13 of 52 slices shown]
[im 1/52]
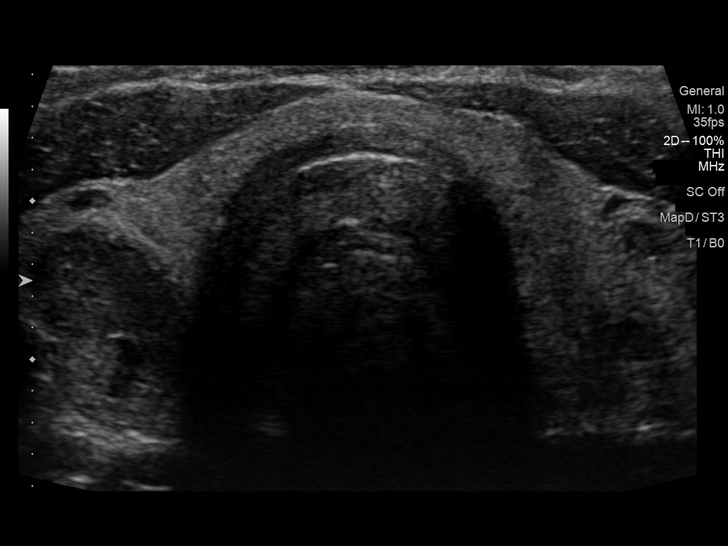
[im 5/52]
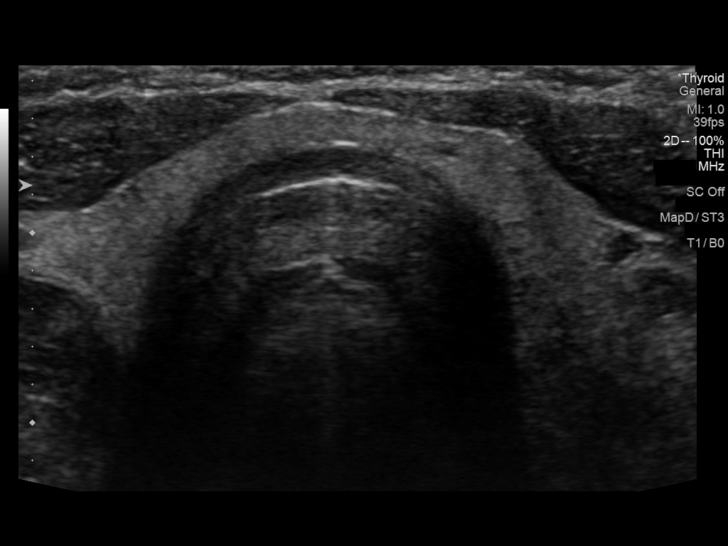
[im 9/52]
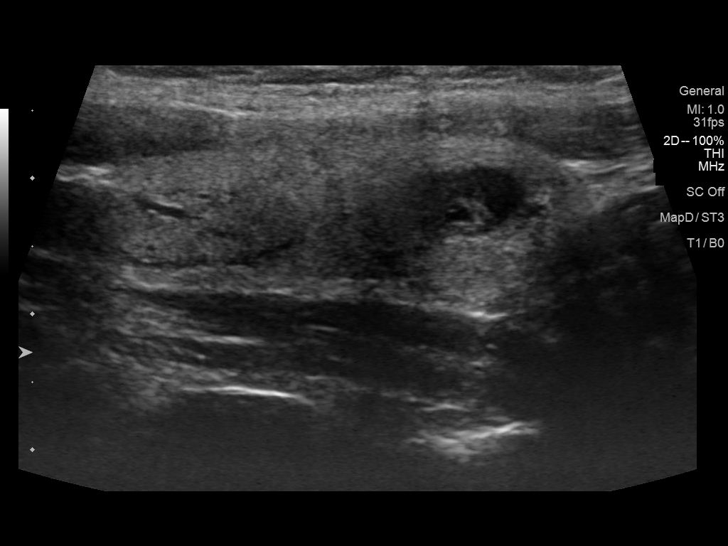
[im 13/52]
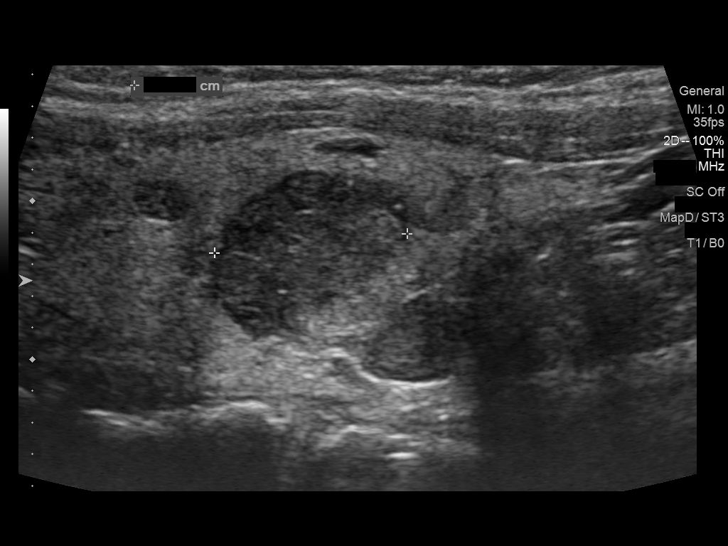
[im 18/52]
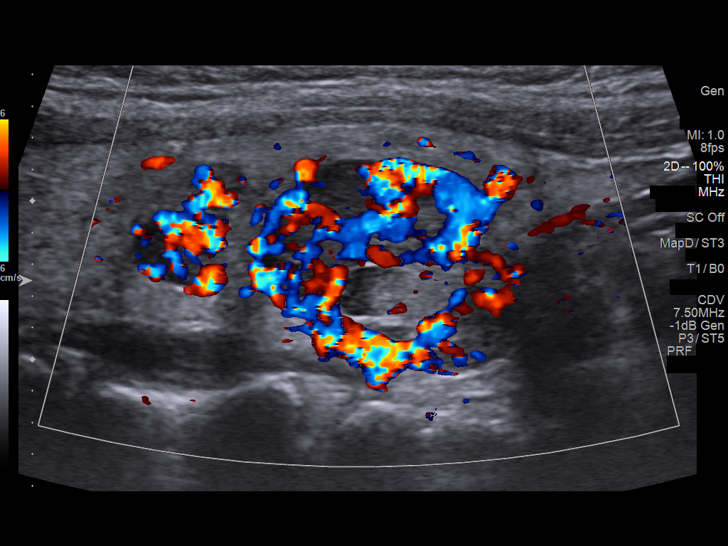
[im 22/52]
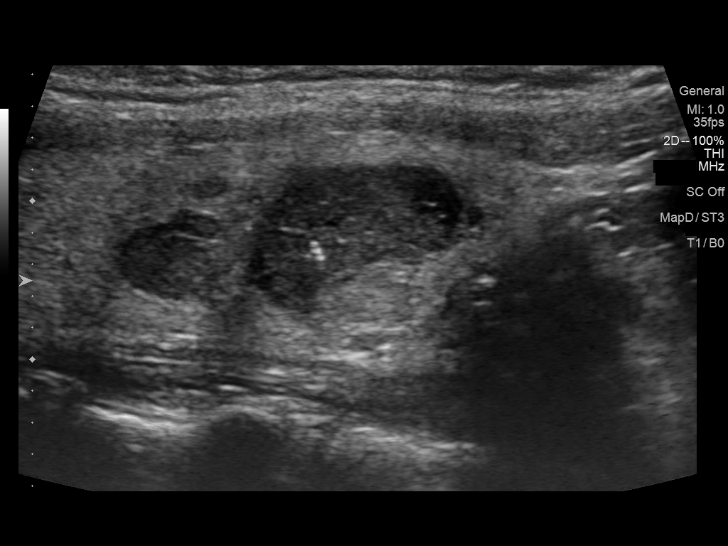
[im 26/52]
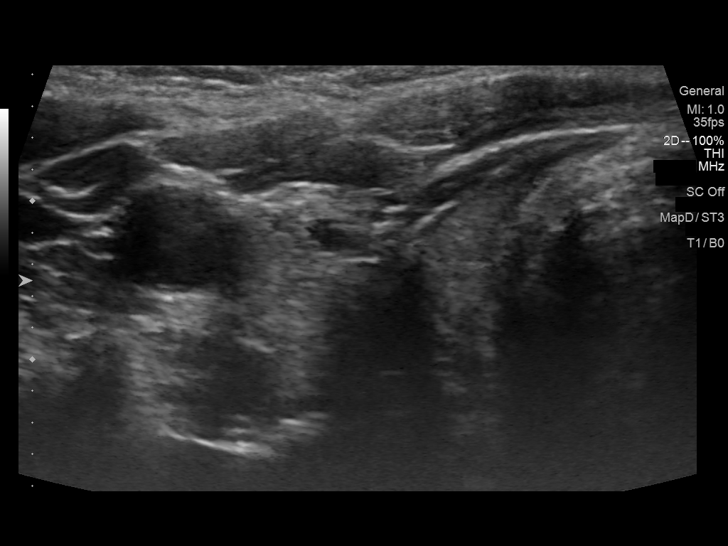
[im 30/52]
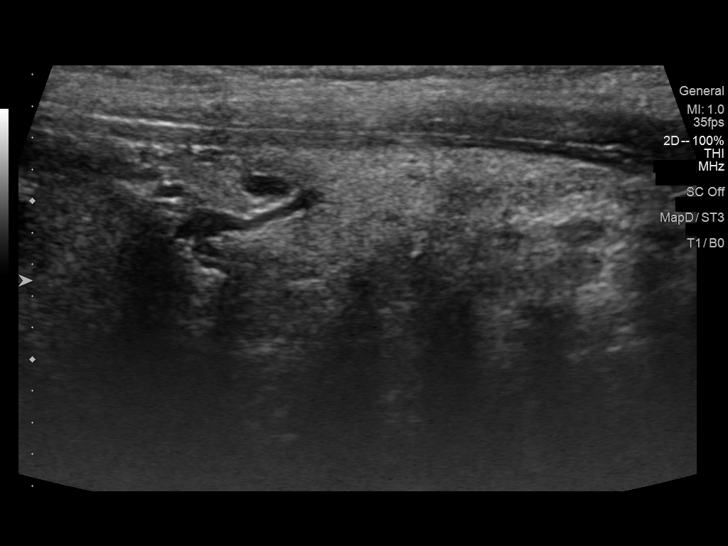
[im 35/52]
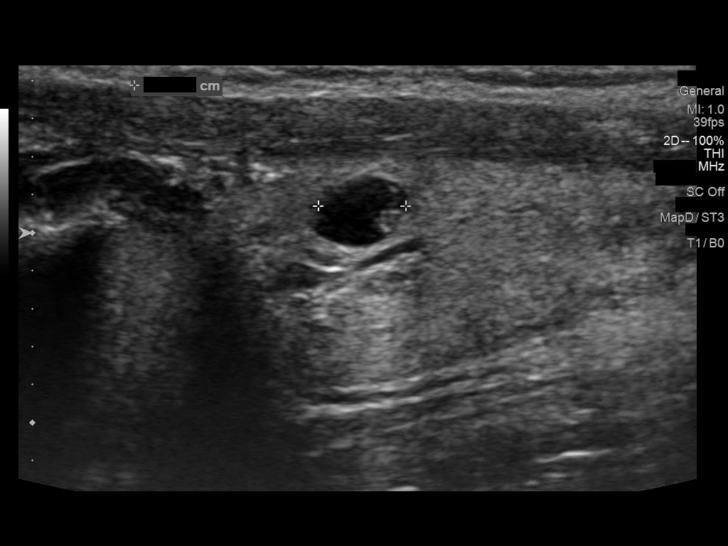
[im 39/52]
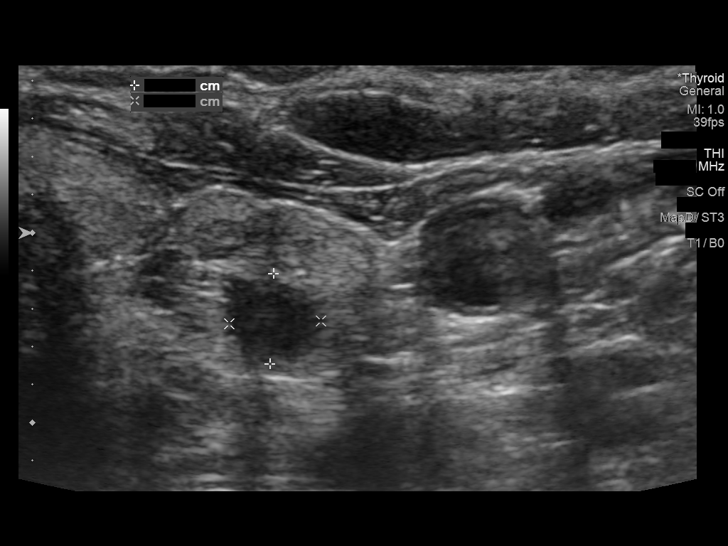
[im 43/52]
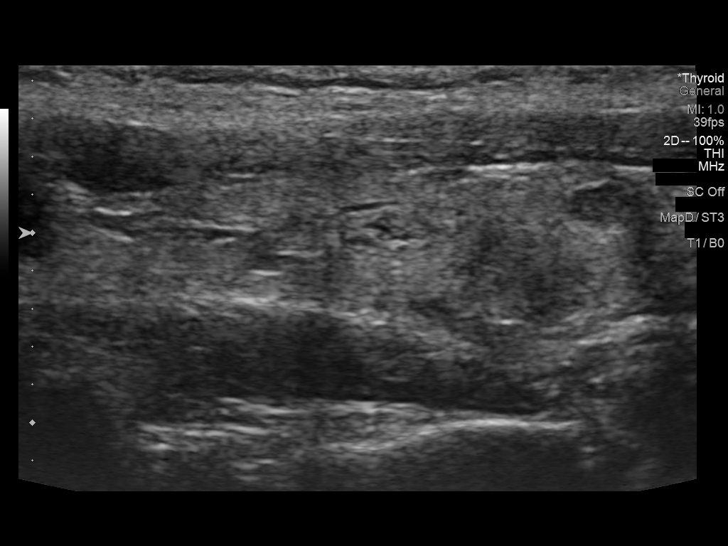
[im 47/52]
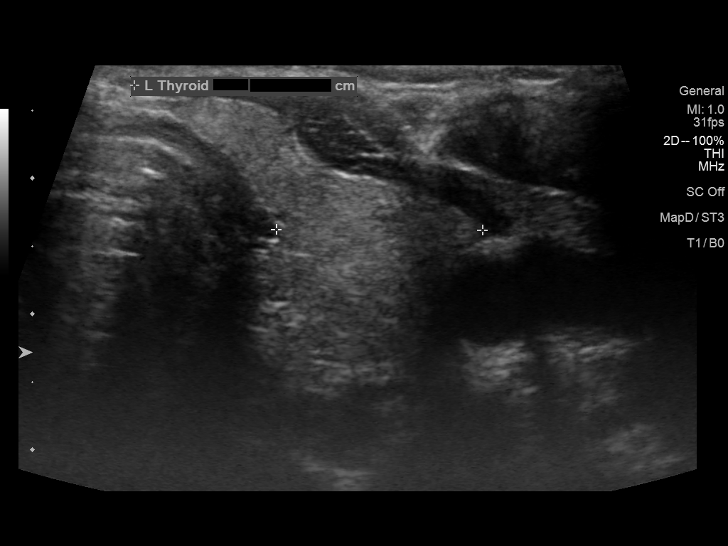
[im 52/52]
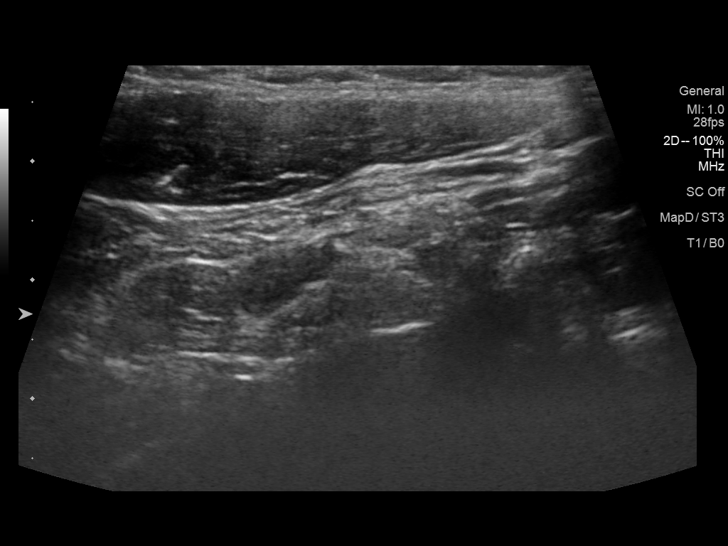

[13 of 25 positions shown; findings below may reference images not displayed]

FINDINGS: Parenchymal Echotexture: Mildly heterogenous

Isthmus: 0.2 cm

Right lobe: 5.2 x 1.7 x 1.4 cm

Left lobe: 4.7 x 1.3 x 1.5 cm

_________________________________________________________

Estimated total number of nodules >/= 1 cm: 1

Number of spongiform nodules >/=  2 cm not described below (TR1): 0

Number of mixed cystic and solid nodules >/= 1.5 cm not described
below (TR2): 0

_________________________________________________________

Nodule # 1:

Location: Right; Mid

Maximum size: 0.6 cm; Other 2 dimensions: 0.6 x 0.5 cm

Composition: solid/almost completely solid (2)

Echogenicity: hypoechoic (2)

Shape: not taller-than-wide (0)

Margins: smooth (0)

Echogenic foci: none (0)

ACR TI-RADS total points: 4.

ACR TI-RADS risk category: TR4 (4-6 points).

ACR TI-RADS recommendations:

Given size (<0.9 cm) and appearance, this nodule does NOT meet
TI-RADS criteria for biopsy or dedicated follow-up.

_________________________________________________________

Nodule # 2:

Location: Right; Inferior

Maximum size: 1.2 cm; Other 2 dimensions: 1.2 x 1.2 cm

Composition: solid/almost completely solid (2)

Echogenicity: hypoechoic (2)

Shape: not taller-than-wide (0)

Margins: smooth (0)

Echogenic foci: none (0)

ACR TI-RADS total points: 4.

ACR TI-RADS risk category: TR4 (4-6 points).

ACR TI-RADS recommendations:

*Given size (>/= 1 - 1.4 cm) and appearance, a follow-up ultrasound
in 1 year should be considered based on TI-RADS criteria.

_________________________________________________________

There are 3 additional small subcentimeter thyroid nodules in the
inferior right thyroid gland as well as the left inferior thyroid
gland. None of these meet criteria for follow-up or biopsy.
IMPRESSION: Borderline enlarged thyroid gland with multiple thyroid nodules as
detailed above.

Only thyroid nodule #2 meets criteria for 1 year follow-up.

The above is in keeping with the ACR TI-RADS recommendations - [HOSPITAL] [DI];[DATE].

## 2019-05-18 DIAGNOSIS — R430 Anosmia: Secondary | ICD-10-CM | POA: Diagnosis not present

## 2019-05-18 DIAGNOSIS — R0981 Nasal congestion: Secondary | ICD-10-CM | POA: Diagnosis not present

## 2019-05-18 DIAGNOSIS — Z20828 Contact with and (suspected) exposure to other viral communicable diseases: Secondary | ICD-10-CM | POA: Diagnosis not present

## 2019-05-18 DIAGNOSIS — G44209 Tension-type headache, unspecified, not intractable: Secondary | ICD-10-CM | POA: Diagnosis not present

## 2019-05-19 DIAGNOSIS — Z20828 Contact with and (suspected) exposure to other viral communicable diseases: Secondary | ICD-10-CM | POA: Diagnosis not present

## 2019-05-19 DIAGNOSIS — R0981 Nasal congestion: Secondary | ICD-10-CM | POA: Diagnosis not present

## 2019-05-19 DIAGNOSIS — R430 Anosmia: Secondary | ICD-10-CM | POA: Diagnosis not present

## 2019-06-01 DIAGNOSIS — Z20828 Contact with and (suspected) exposure to other viral communicable diseases: Secondary | ICD-10-CM | POA: Diagnosis not present

## 2019-07-10 DIAGNOSIS — Z3042 Encounter for surveillance of injectable contraceptive: Secondary | ICD-10-CM | POA: Diagnosis not present

## 2019-09-25 DIAGNOSIS — Z3042 Encounter for surveillance of injectable contraceptive: Secondary | ICD-10-CM | POA: Diagnosis not present

## 2019-12-11 DIAGNOSIS — Z3042 Encounter for surveillance of injectable contraceptive: Secondary | ICD-10-CM | POA: Diagnosis not present

## 2020-01-30 DIAGNOSIS — Z01419 Encounter for gynecological examination (general) (routine) without abnormal findings: Secondary | ICD-10-CM | POA: Diagnosis not present

## 2020-01-30 DIAGNOSIS — Z0000 Encounter for general adult medical examination without abnormal findings: Secondary | ICD-10-CM | POA: Diagnosis not present

## 2020-01-30 DIAGNOSIS — Z131 Encounter for screening for diabetes mellitus: Secondary | ICD-10-CM | POA: Diagnosis not present

## 2020-01-30 DIAGNOSIS — Z13220 Encounter for screening for lipoid disorders: Secondary | ICD-10-CM | POA: Diagnosis not present

## 2020-01-30 DIAGNOSIS — Z6830 Body mass index (BMI) 30.0-30.9, adult: Secondary | ICD-10-CM | POA: Diagnosis not present

## 2020-01-30 DIAGNOSIS — Z124 Encounter for screening for malignant neoplasm of cervix: Secondary | ICD-10-CM | POA: Diagnosis not present

## 2020-01-30 DIAGNOSIS — Z1329 Encounter for screening for other suspected endocrine disorder: Secondary | ICD-10-CM | POA: Diagnosis not present

## 2020-01-30 DIAGNOSIS — Z130 Encounter for screening for diseases of the blood and blood-forming organs and certain disorders involving the immune mechanism: Secondary | ICD-10-CM | POA: Diagnosis not present

## 2020-01-30 DIAGNOSIS — Z1151 Encounter for screening for human papillomavirus (HPV): Secondary | ICD-10-CM | POA: Diagnosis not present

## 2020-02-26 ENCOUNTER — Encounter (HOSPITAL_COMMUNITY): Payer: Self-pay | Admitting: Emergency Medicine

## 2020-02-26 ENCOUNTER — Emergency Department (HOSPITAL_COMMUNITY): Payer: BC Managed Care – PPO

## 2020-02-26 ENCOUNTER — Emergency Department (HOSPITAL_COMMUNITY)
Admission: EM | Admit: 2020-02-26 | Discharge: 2020-02-26 | Disposition: A | Discharge status: Home / Self Care | Payer: BC Managed Care – PPO | Attending: Emergency Medicine | Admitting: Emergency Medicine

## 2020-02-26 ENCOUNTER — Other Ambulatory Visit: Payer: Self-pay

## 2020-02-26 DIAGNOSIS — R4789 Other speech disturbances: Secondary | ICD-10-CM | POA: Diagnosis not present

## 2020-02-26 DIAGNOSIS — R479 Unspecified speech disturbances: Secondary | ICD-10-CM

## 2020-02-26 DIAGNOSIS — R9431 Abnormal electrocardiogram [ECG] [EKG]: Secondary | ICD-10-CM | POA: Diagnosis not present

## 2020-02-26 DIAGNOSIS — R4781 Slurred speech: Secondary | ICD-10-CM | POA: Diagnosis not present

## 2020-02-26 DIAGNOSIS — F8081 Childhood onset fluency disorder: Secondary | ICD-10-CM | POA: Diagnosis not present

## 2020-02-26 DIAGNOSIS — R202 Paresthesia of skin: Secondary | ICD-10-CM | POA: Diagnosis not present

## 2020-02-26 DIAGNOSIS — R29818 Other symptoms and signs involving the nervous system: Secondary | ICD-10-CM | POA: Diagnosis not present

## 2020-02-26 LAB — CBG MONITORING, ED: Glucose-Capillary: 64 mg/dL — ABNORMAL LOW (ref 70–99)

## 2020-02-26 LAB — URINALYSIS, ROUTINE W REFLEX MICROSCOPIC
Bilirubin Urine: NEGATIVE
Glucose, UA: NEGATIVE mg/dL
Ketones, ur: NEGATIVE mg/dL
Nitrite: NEGATIVE
Protein, ur: NEGATIVE mg/dL
Specific Gravity, Urine: 1.019 (ref 1.005–1.030)
pH: 6 (ref 5.0–8.0)

## 2020-02-26 LAB — BASIC METABOLIC PANEL
Anion gap: 8 (ref 5–15)
BUN: 6 mg/dL (ref 6–20)
CO2: 24 mmol/L (ref 22–32)
Calcium: 9.1 mg/dL (ref 8.9–10.3)
Chloride: 109 mmol/L (ref 98–111)
Creatinine, Ser: 0.81 mg/dL (ref 0.44–1.00)
GFR calc Af Amer: >60 mL/min (ref >60)
GFR calc non Af Amer: >60 mL/min (ref >60)
Glucose, Bld: 91 mg/dL (ref 70–99)
Potassium: 3.4 mmol/L — ABNORMAL LOW (ref 3.5–5.1)
Sodium: 141 mmol/L (ref 135–145)

## 2020-02-26 LAB — CBC
HCT: 42.9 % (ref 36.0–46.0)
Hemoglobin: 14.3 g/dL (ref 12.0–15.0)
MCH: 31 pg (ref 26.0–34.0)
MCHC: 33.3 g/dL (ref 30.0–36.0)
MCV: 92.9 fL (ref 80.0–100.0)
Platelets: 201 10*3/uL (ref 150–400)
RBC: 4.62 MIL/uL (ref 3.87–5.11)
RDW: 13.2 % (ref 11.5–15.5)
WBC: 5.4 10*3/uL (ref 4.0–10.5)
nRBC: 0 % (ref 0.0–0.2)

## 2020-02-26 LAB — I-STAT BETA HCG BLOOD, ED (MC, WL, AP ONLY): I-stat hCG, quantitative: <5 m[IU]/mL (ref <5)

## 2020-02-26 IMAGING — CT CT HEAD W/O CM
4 series · 16 of 47 positions shown, 18 images · non-contrast
Comparison: No pertinent prior studies available for comparison.

CLINICAL DATA: Neuro deficit, subacute. Additional provided:
Possible stroke, high blood pressure, slurred speech

EXAM:
CT HEAD WITHOUT CONTRAST
TECHNIQUE: Contiguous axial images were obtained from the base of the skull
through the vertex without intravenous contrast.

[Series 3: head wo · axial · 0.39mm/px · z∈[-149,-49]mm · 7 of 28 slices shown, 9 images]
[im 4/28  brain]
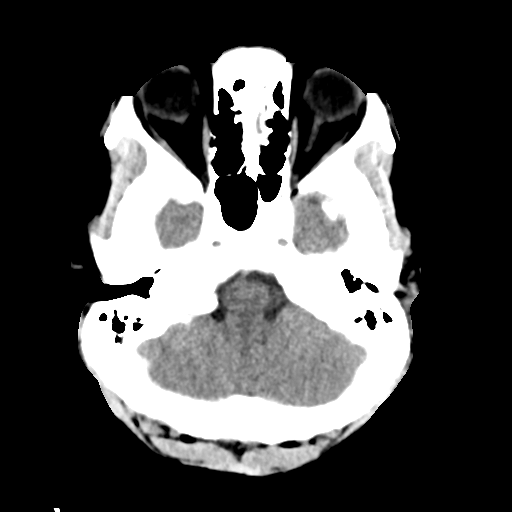
[im 4/28  bone]
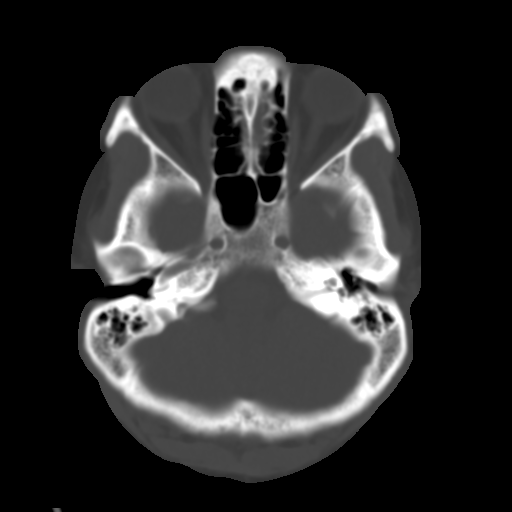
[im 7/28  brain]
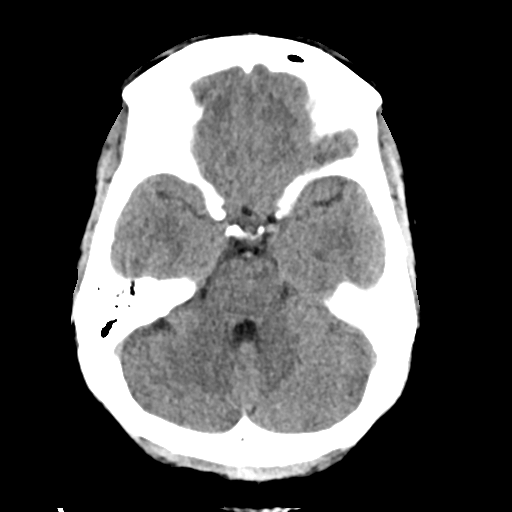
[im 11/28  brain]
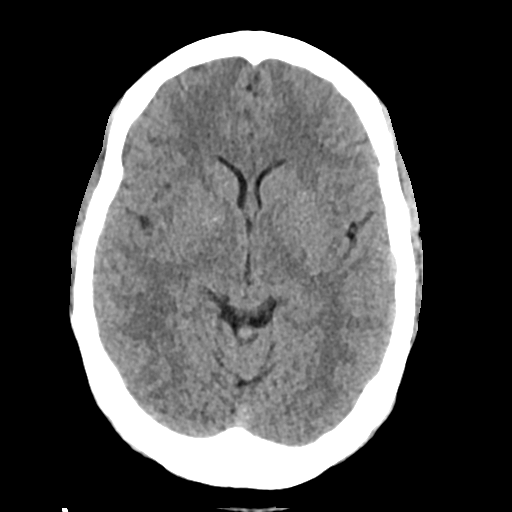
[im 14/28  brain]
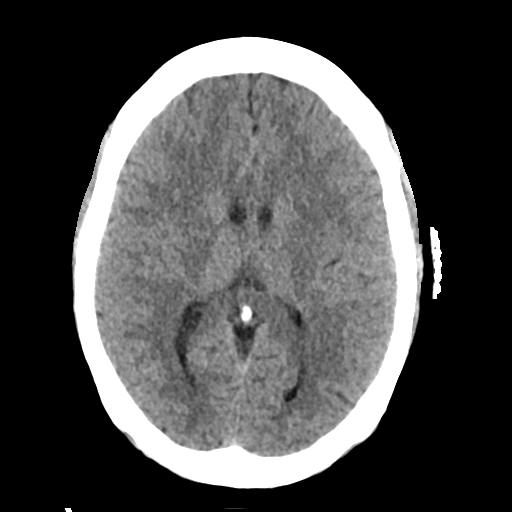
[im 17/28  brain]
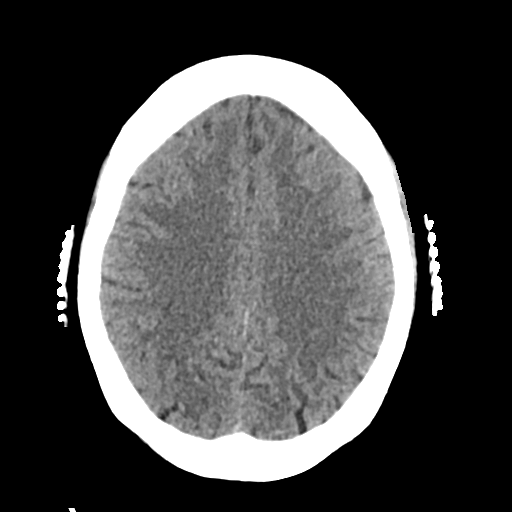
[im 17/28  bone]
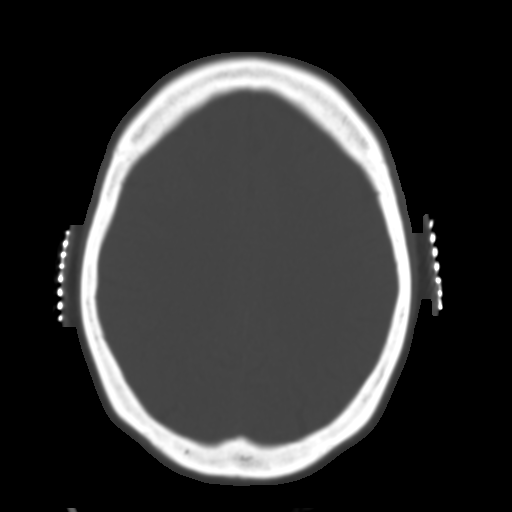
[im 21/28  brain]
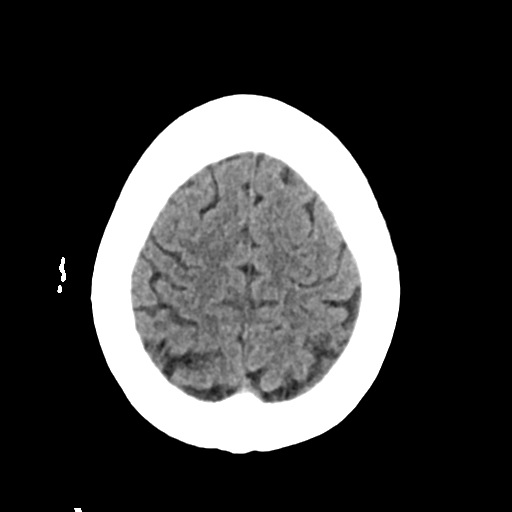
[im 24/28  brain]
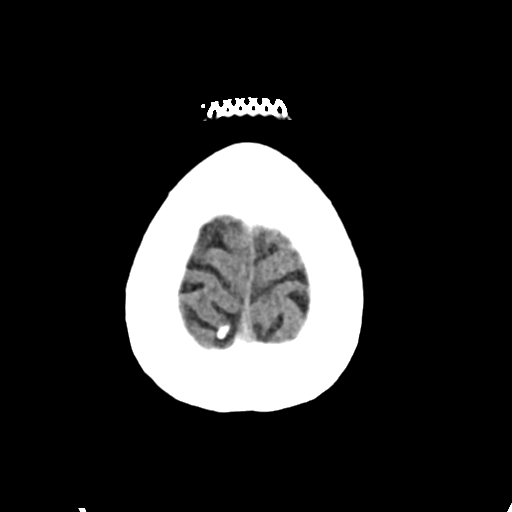

[Series 4: head bone · axial · 0.39mm/px · z∈[-152,-124]mm · 3 of 68 slices shown]
[im 7/68  bone]
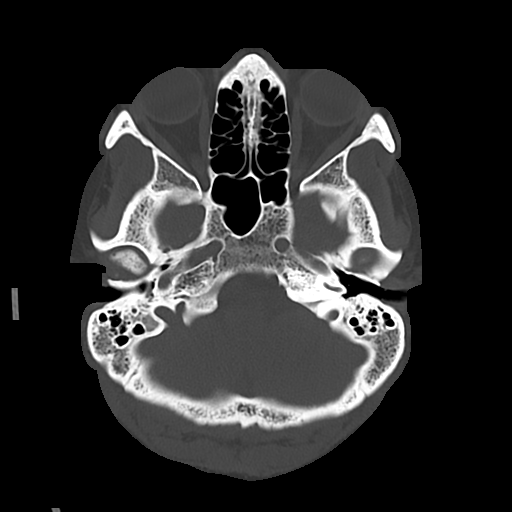
[im 14/68  bone]
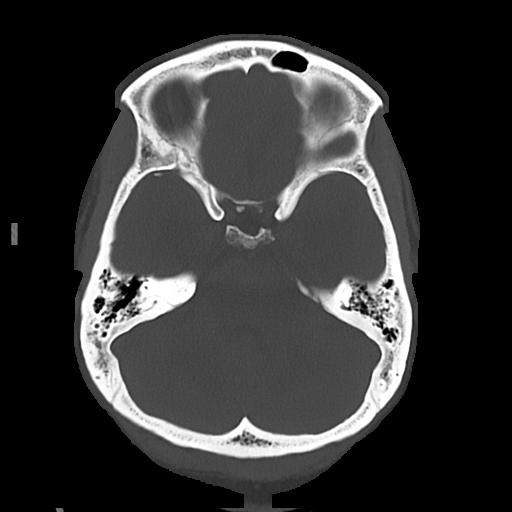
[im 21/68  bone]
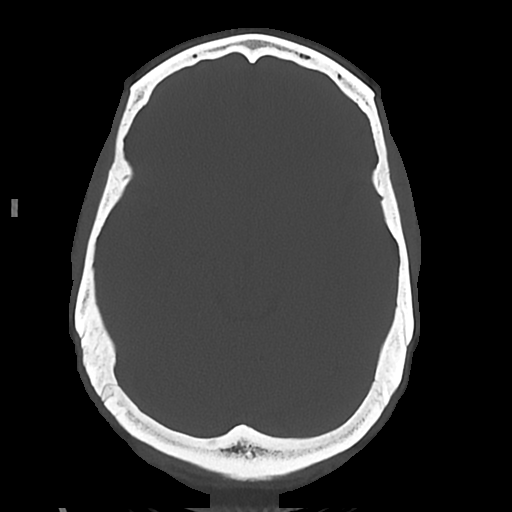

[Series 5: cor soft · coronal · 0.30mm/px · 3 of 65 slices shown]
[im 22/65  brain]
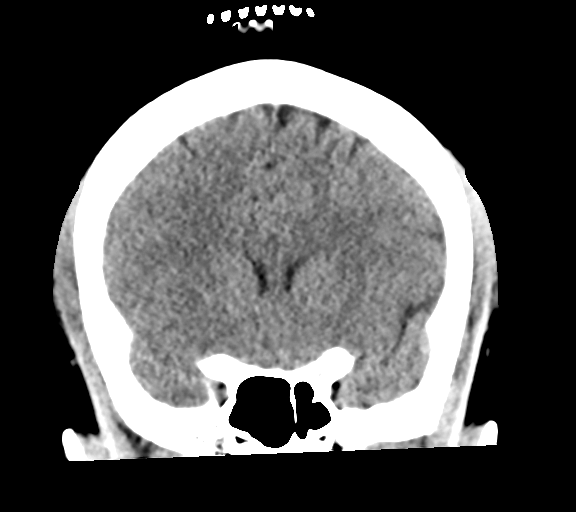
[im 29/65  brain]
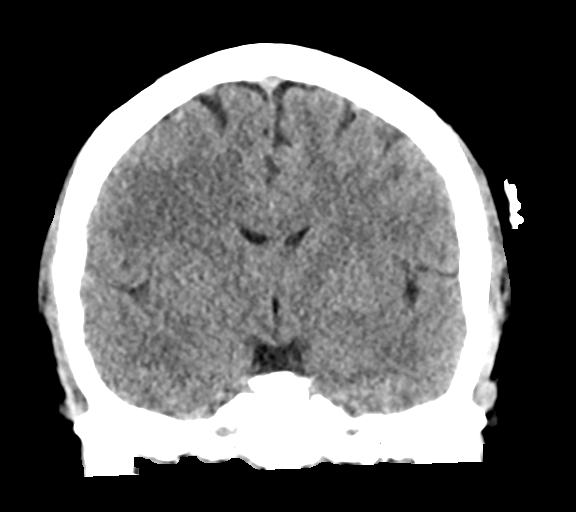
[im 36/65  brain]
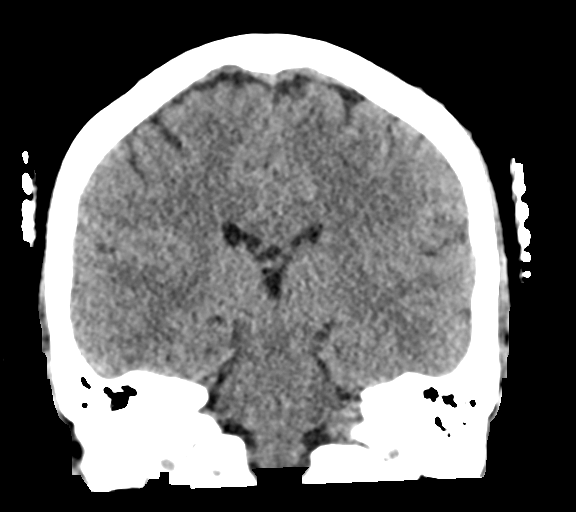

[Series 6: sag soft · sagittal · 0.30mm/px · 3 of 57 slices shown]
[im 19/57  brain]
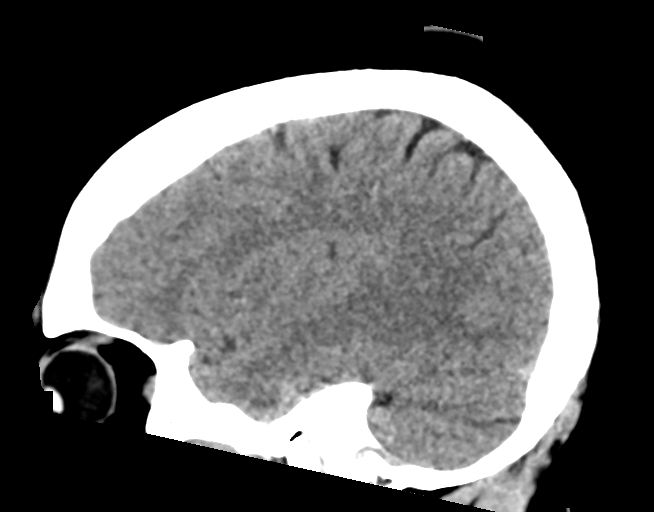
[im 29/57  brain]
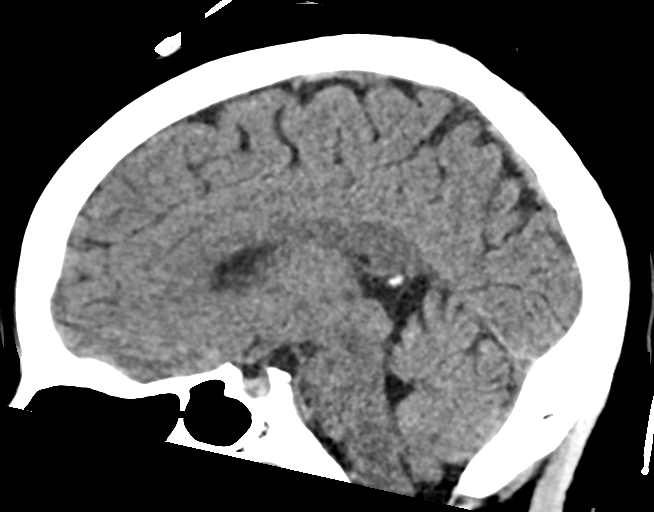
[im 38/57  brain]
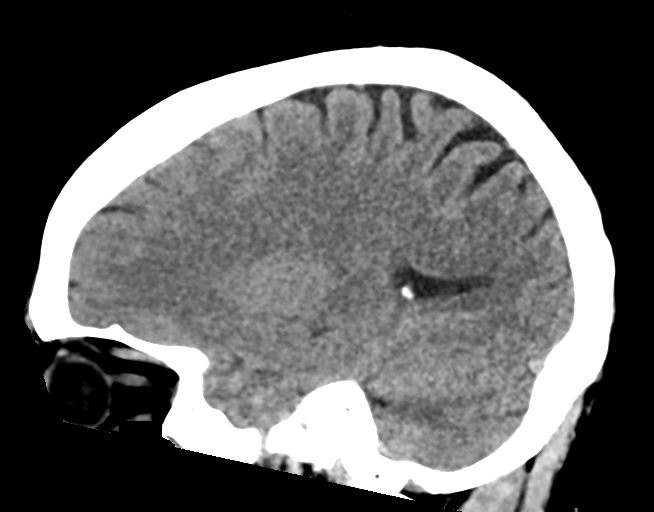

[16 of 47 positions shown; findings below may reference images not displayed]

FINDINGS: Brain:

Cerebral volume is normal for age.

There is no acute intracranial hemorrhage.

No demarcated cortical infarct.

No extra-axial fluid collection.

No evidence of intracranial mass.

No midline shift.

Vascular: No hyperdense vessel

Skull: Normal. Negative for fracture or focal lesion.

Sinuses/Orbits: Visualized orbits show no acute finding. No
significant paranasal sinus disease or mastoid effusion at the
imaged levels.
IMPRESSION: Unremarkable non-contrast CT appearance of the brain. No evidence of
acute intracranial abnormality.

## 2020-02-26 IMAGING — MR MR HEAD WO/W CM
14 of 16 series · 40 of 48 positions shown · IV contrast (gadavist)
Comparison: None.

CLINICAL DATA: Slurred speech

EXAM:
MRI HEAD WITHOUT AND WITH CONTRAST
TECHNIQUE: Multiplanar, multiecho pulse sequences of the brain and surrounding
structures were obtained without and with intravenous contrast.
CONTRAST:  8mL GADAVIST GADOBUTROL 1 MMOL/ML IV SOLN

[Series 5: DWI · axial · 3.0mm · 0.88mm/px · z∈[-121,+24]mm · 6 of 100 slices shown (1 of 4)]
[im 1/100]
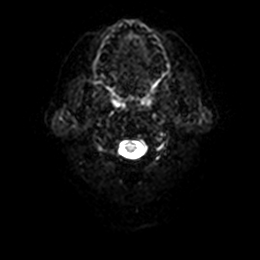
[im 20/100]
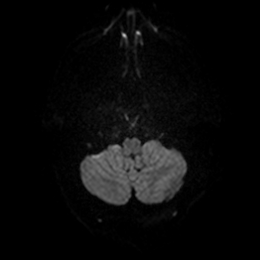
[im 40/100]
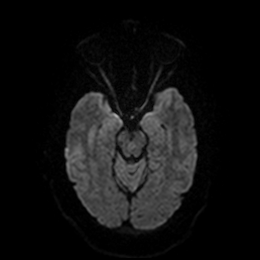
[im 60/100]
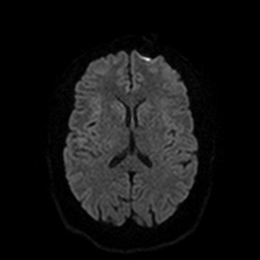
[im 80/100]
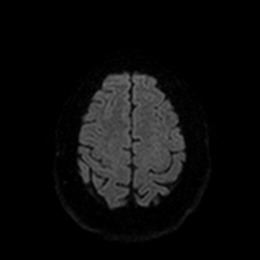
[im 100/100]
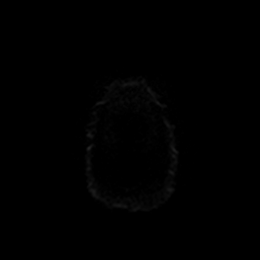

[Series 6: DWI · axial · 3.0mm · 0.88mm/px · z∈[-121,+24]mm · 2 of 50 slices shown (2 of 4)]
[im 1/50]
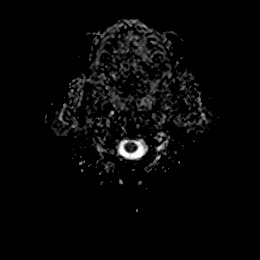
[im 50/50]
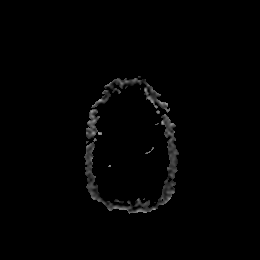

[Series 7: DWI · coronal · 4.0mm · 0.88mm/px · 4 of 68 slices shown (3 of 4)]
[im 1/68]
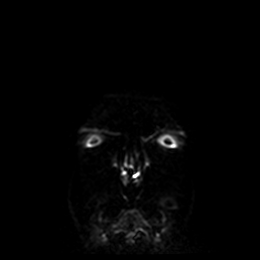
[im 23/68]
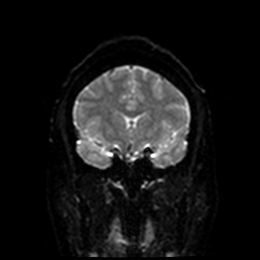
[im 45/68]
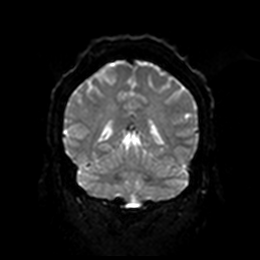
[im 68/68]
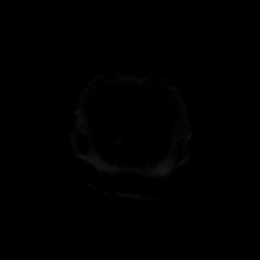

[Series 8: DWI · coronal · 4.0mm · 0.88mm/px · 1 of 34 slices shown (4 of 4)]
[im 1/34]
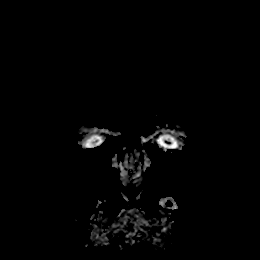

[Series 9: T1 · sagittal · 5.0mm · 0.75mm/px · 2 of 23 slices shown]
[im 1/23]
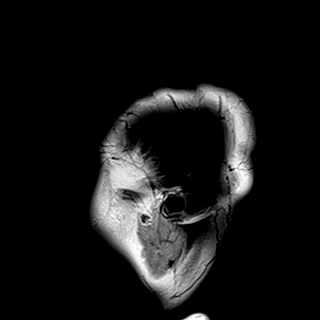
[im 23/23]
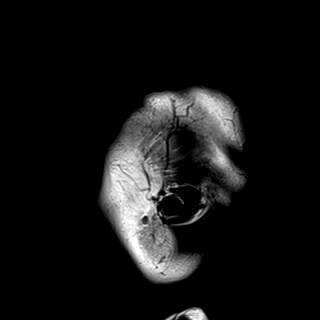

[Series 10: T2 · axial · 5.0mm · 0.72mm/px · z∈[-118,+24]mm · 2 of 25 slices shown]
[im 1/25]
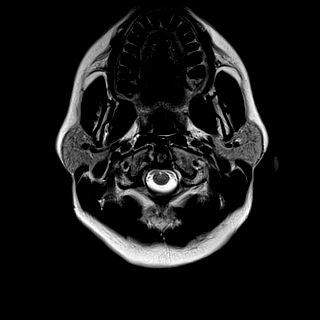
[im 25/25]
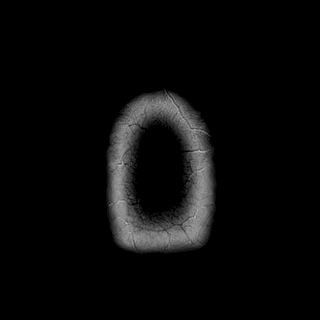

[Series 11: FLAIR · axial · 5.0mm · 0.45mm/px · z∈[-118,+24]mm · 2 of 25 slices shown]
[im 1/25]
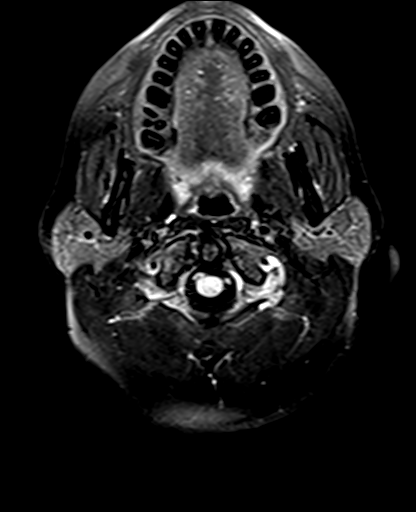
[im 25/25]
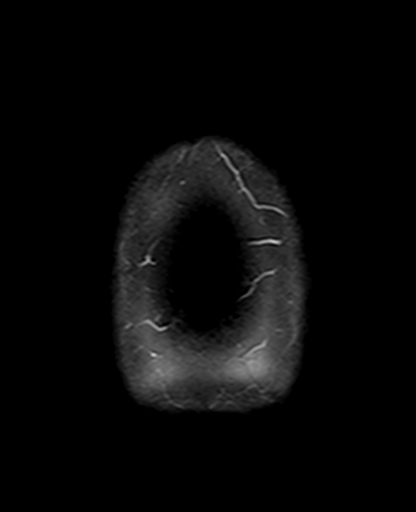

[Series 12: mag_images · axial · 3.0mm · 0.90mm/px · z∈[-122,+29]mm · 4 of 52 slices shown]
[im 1/52]
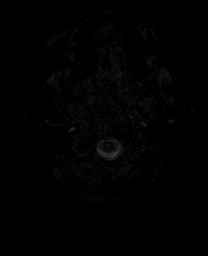
[im 18/52]
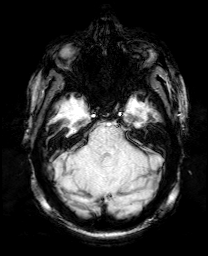
[im 35/52]
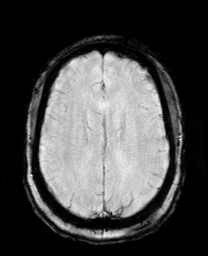
[im 52/52]
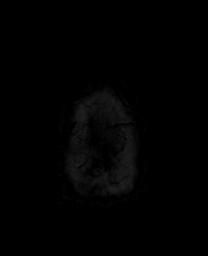

[Series 13: pha_images · axial · 3.0mm · 0.90mm/px · z∈[-122,+29]mm · 4 of 52 slices shown]
[im 1/52]
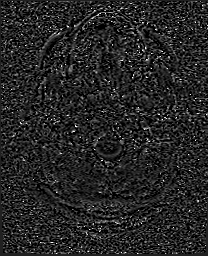
[im 18/52]
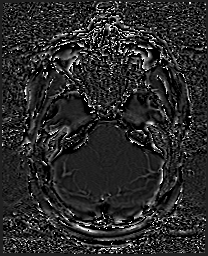
[im 35/52]
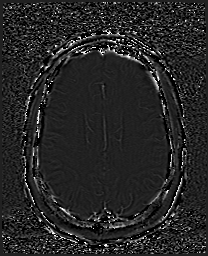
[im 52/52]
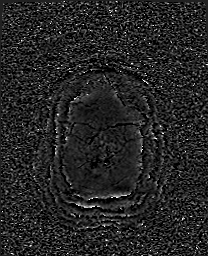

[Series 14: swi_images · axial · 3.0mm · 0.90mm/px · z∈[-122,+29]mm · 4 of 52 slices shown]
[im 1/52]
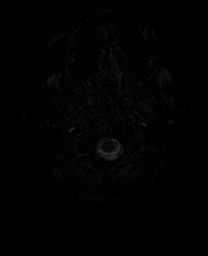
[im 18/52]
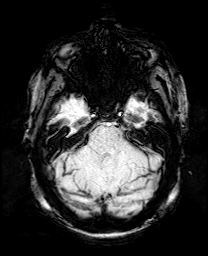
[im 35/52]
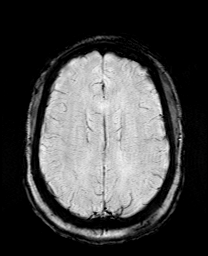
[im 52/52]
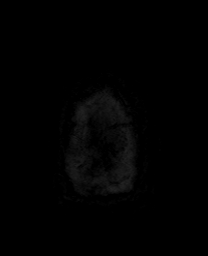

[Series 15: mip_images(sw) · axial · 24.0mm · 0.90mm/px · z∈[-111,+19]mm · 3 of 45 slices shown]
[im 1/45]
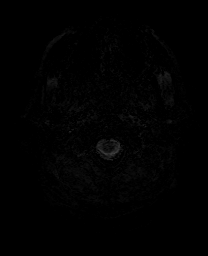
[im 23/45]
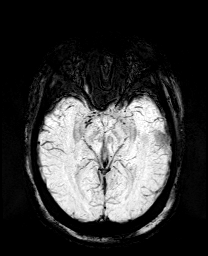
[im 45/45]
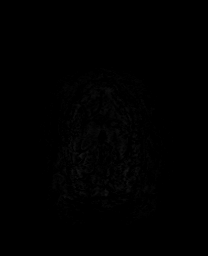

[Series 22: T2 post-contrast · coronal · 5.0mm · 0.72mm/px · 2 of 30 slices shown]
[im 1/30]
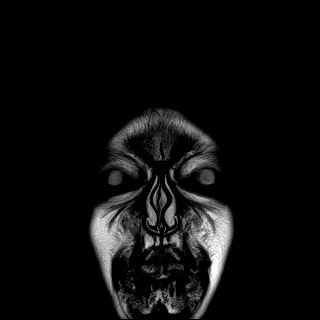
[im 30/30]
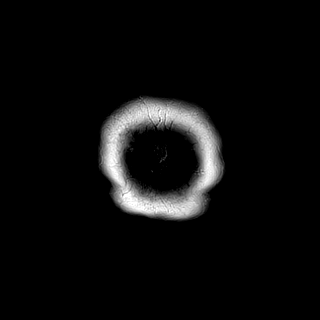

[Series 24: T1 post-contrast · coronal · 5.0mm · 0.34mm/px · 2 of 30 slices shown (1 of 2)]
[im 1/30]
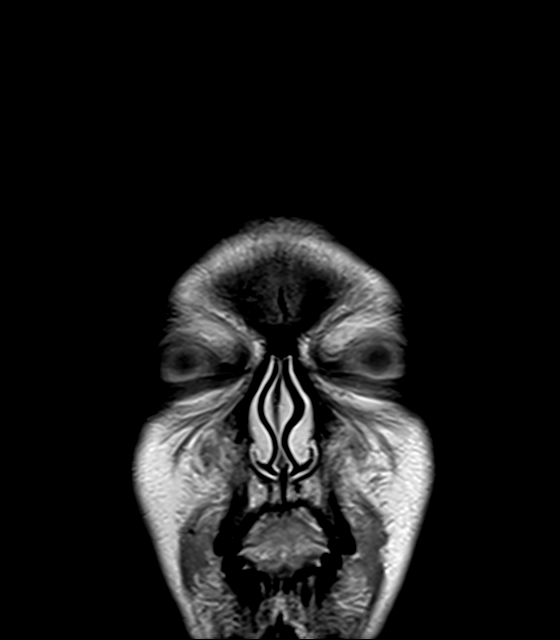
[im 30/30]
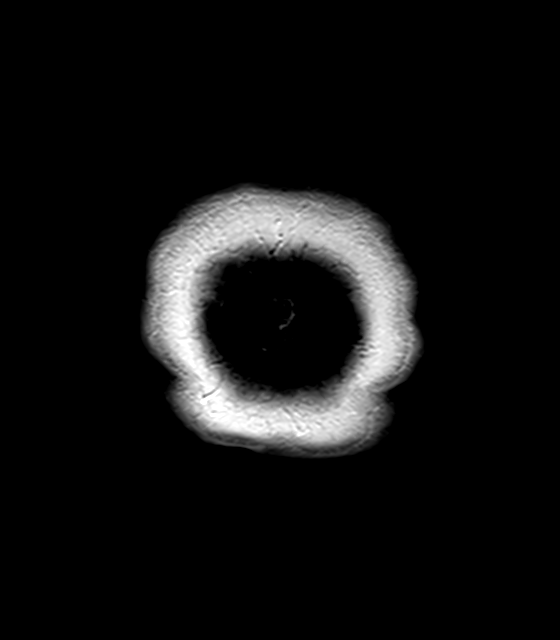

[Series 25: T1 post-contrast · sagittal · 5.0mm · 0.72mm/px · 2 of 23 slices shown (2 of 2)]
[im 1/23]
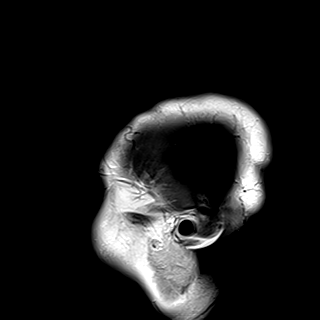
[im 23/23]
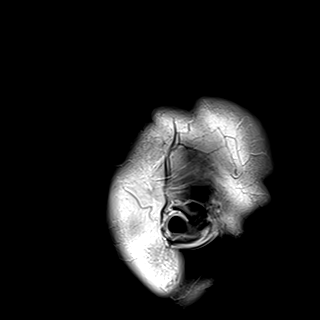

[40 of 48 positions shown; findings below may reference images not displayed]

FINDINGS: BRAIN: No acute infarct, acute hemorrhage or extra-axial collection.
Normal white matter signal. Normal volume of CSF spaces. No chronic
microhemorrhage. Normal midline structures. There is no abnormal
contrast enhancement.

VASCULAR: Major flow voids are preserved.

SKULL AND UPPER CERVICAL SPINE: Normal calvarium and skull base.
Visualized upper cervical spine and soft tissues are normal.

SINUSES/ORBITS: No paranasal sinus fluid levels or advanced mucosal
thickening. No mastoid or middle ear effusion. Normal orbits.
IMPRESSION: Normal brain MRI.

## 2020-02-26 MED ORDER — SODIUM CHLORIDE 0.9% FLUSH: 3.0000 mL | Freq: Once | INTRAVENOUS | Status: DC

## 2020-02-26 MED ORDER — LORAZEPAM 2 MG/ML IJ SOLN
1.0000 mg | Freq: Once | INTRAMUSCULAR | Status: AC
  Administered 2020-02-26: 1 mg via INTRAVENOUS
  Filled 2020-02-26: qty 1

## 2020-02-26 MED ORDER — GADOBUTROL 1 MMOL/ML IV SOLN
8.0000 mL | Freq: Once | INTRAVENOUS | Status: AC | PRN
  Administered 2020-02-26: 8 mL via INTRAVENOUS

## 2020-02-26 NOTE — ED Notes (Signed)
8 oz apple juice given for CBG 64

## 2020-02-26 NOTE — ED Notes (Signed)
Transported to MRI

## 2020-02-26 NOTE — ED Triage Notes (Signed)
Pt states she was sent here by pcp for possible to be evaluated for possible stroke. Pt states she has high blood pressure when she is anxious. Pt is AO x 4 on triage, with slurred speech no other neuro deficit noticed. Pt states all of this started last Friday.

## 2020-02-26 NOTE — ED Provider Notes (Signed)
11:08 PM Assumed care from Dr. Lockie Mola, please see their note for full history, physical and decision making until this point. In brief this is a 39 y.o. year old female who presented to the ED tonight with slurred speech     Patient pending MRI.  Urine is abnormal patient is asymptomatic so is pending culture no antibiotics.  MRI is negative.  On my evaluation the patient she has no dysarthria or slurred speech just every once in a while she has drawn out words.  Does not necessarily seem volitional but also does not seem to be a focal neurologic deficit.  Discussed with her reasons to return to the hospital and that this would likely resolve on its own if she stopped pain in any attention and that could be related to stress or multiple other factors.  Patient nodded in agreement and was satisfied with her questions being answered and stable for discharge.  Labs, studies and imaging reviewed by myself and considered in medical decision making if ordered. Imaging interpreted by radiology.  Labs Reviewed  BASIC METABOLIC PANEL - Abnormal; Notable for the following components:      Result Value   Potassium 3.4 (*)    All other components within normal limits  URINALYSIS, ROUTINE W REFLEX MICROSCOPIC - Abnormal; Notable for the following components:   APPearance CLOUDY (*)    Hgb urine dipstick LARGE (*)    Leukocytes,Ua LARGE (*)    Bacteria, UA RARE (*)    All other components within normal limits  CBG MONITORING, ED - Abnormal; Notable for the following components:   Glucose-Capillary 64 (*)    All other components within normal limits  URINE CULTURE  CBC  I-STAT BETA HCG BLOOD, ED ([facility], [facility], [facility] ONLY)    CT Head Wo Contrast  Final Result    MR Brain W and Wo Contrast    (Results Pending)    No follow-ups on file.    Marily Memos, MD 02/26/20 2340

## 2020-02-26 NOTE — ED Provider Notes (Addendum)
[facility] EMERGENCY DEPARTMENT Provider Note   CSN: [id number] Arrival date & time: 02/26/20  1148     History Chief Complaint  Patient presents with  . slurred speech    Gloria Fernandez is a 39 y.o. female.  Patient with slurred speech for the last several days.  States that she is under a lot of stress and anxiety.  This is happened once before in the past and self resolved.  No history of stroke.  Was sent from primary care doctor's office for stroke work-up.  Denies any weakness or numbness.  No vision changes.  No history of MS in the family.  Denies any headache or neck pain.  Possibly has some blurred vision when this first started but no longer.  The history is provided by the patient.  Neurologic Problem This is a new problem. The current episode started more than 2 days ago. The problem occurs daily. The problem has not changed since onset.Pertinent negatives include no chest pain, no abdominal pain, no headaches and no shortness of breath. Nothing aggravates the symptoms. Nothing relieves the symptoms. She has tried nothing for the symptoms. The treatment provided no relief.       Past Medical History:  Diagnosis Date  . No pertinent past medical history     Patient Active Problem List   Diagnosis Date Noted  . H/O abnormal Pap smear H/O LEEP 04/28/2013  . Family history of cardiovascular disease 04/28/2013    Past Surgical History:  Procedure Laterality Date  . DILATION AND CURETTAGE OF UTERUS    . LEEP  2000     OB History    Gravida  4   Para  3   Term  2   Preterm  1   AB  1   Living  3     SAB  1   TAB  0   Ectopic  0   Multiple  0   Live Births  3           Family History  Problem Relation Age of Onset  . Anesthesia problems Neg Hx   . Hypotension Neg Hx   . Malignant hyperthermia Neg Hx   . Pseudochol deficiency Neg Hx     Social History   Tobacco Use  . Smoking status: Never Smoker  . Smokeless  tobacco: Never Used  Substance Use Topics  . Alcohol use: Yes    Alcohol/week: 0.0 standard drinks    Comment: Occass.   . Drug use: No    Home Medications Prior to Admission medications   Medication Sig Start Date End Date Taking? Authorizing Provider  medroxyPROGESTERone (DEPO-PROVERA) 150 MG/ML injection INJECT 1 ML (150 MG TOTAL) INTO THE MUSCLE EVERY 3 (THREE) MONTHS. 07/06/16   Brock Bad, MD  methocarbamol (ROBAXIN) 500 MG tablet Take 1 tablet (500 mg total) by mouth 2 (two) times daily. 05/12/19   Cathie Hoops, Amy V, PA-C  polyethylene glycol (MIRALAX) 17 g packet Take 17 g by mouth daily. 05/12/19   Belinda Fisher, PA-C    Allergies    Patient has no known allergies.  Review of Systems   Review of Systems  Constitutional: Negative for chills and fever.  HENT: Negative for ear pain and sore throat.   Eyes: Negative for pain and visual disturbance.  Respiratory: Negative for cough and shortness of breath.   Cardiovascular: Negative for chest pain and palpitations.  Gastrointestinal: Negative for abdominal pain and vomiting.  Genitourinary:  Negative for dysuria and hematuria.  Musculoskeletal: Negative for arthralgias and back pain.  Skin: Negative for color change and rash.  Neurological: Positive for speech difficulty (speech stuttering). Negative for dizziness, tremors, seizures, syncope, facial asymmetry, weakness, light-headedness, numbness and headaches.  All other systems reviewed and are negative.   Physical Exam Updated Vital Signs  ED Triage Vitals  Enc Vitals Group     BP 02/26/20 1156 (!) 153/115     Pulse Rate 02/26/20 1156 81     Resp 02/26/20 1156 18     Temp 02/26/20 1156 98.3 F (36.8 C)     Temp Source 02/26/20 1720 Oral     SpO2 02/26/20 1156 100 %     Weight 02/26/20 1157 182 lb 8.7 oz (82.8 kg)     Height 02/26/20 1157 5\' 5"  (1.651 m)     Head Circumference --      Peak Flow --      Pain Score 02/26/20 1156 7     Pain Loc --      Pain Edu? --       Excl. in GC? --     Physical Exam Vitals and nursing note reviewed.  Constitutional:      General: She is not in acute distress.    Appearance: She is well-developed. She is not ill-appearing.  HENT:     Head: Normocephalic and atraumatic.     Nose: Nose normal.     Mouth/Throat:     Mouth: Mucous membranes are moist.  Eyes:     Extraocular Movements: Extraocular movements intact.     Conjunctiva/sclera: Conjunctivae normal.     Pupils: Pupils are equal, round, and reactive to light.  Cardiovascular:     Rate and Rhythm: Normal rate and regular rhythm.     Pulses: Normal pulses.     Heart sounds: Normal heart sounds. No murmur heard.   Pulmonary:     Effort: Pulmonary effort is normal. No respiratory distress.     Breath sounds: Normal breath sounds.  Abdominal:     General: Abdomen is flat.     Palpations: Abdomen is soft.     Tenderness: There is no abdominal tenderness.  Musculoskeletal:     Cervical back: Normal range of motion and neck supple.  Skin:    General: Skin is warm and dry.  Neurological:     General: No focal deficit present.     Mental Status: She is alert and oriented to person, place, and time.     Cranial Nerves: No cranial nerve deficit.     Sensory: No sensory deficit.     Motor: No weakness.     Coordination: Coordination normal.     Gait: Gait normal.     Comments: 5+ out of 5 strength throughout, no drift, normal gait, normal finger-to-nose finger, stuttering speech, no visual field deficit  Psychiatric:        Mood and Affect: Mood normal.     ED Results / Procedures / Treatments   Labs (all labs ordered are listed, but only abnormal results are displayed) Labs Reviewed  BASIC METABOLIC PANEL - Abnormal; Notable for the following components:      Result Value   Potassium 3.4 (*)    All other components within normal limits  URINALYSIS, ROUTINE W REFLEX MICROSCOPIC - Abnormal; Notable for the following components:   APPearance CLOUDY  (*)    Hgb urine dipstick LARGE (*)    Leukocytes,Ua LARGE (*)  Bacteria, UA RARE (*)    All other components within normal limits  CBG MONITORING, ED - Abnormal; Notable for the following components:   Glucose-Capillary 64 (*)    All other components within normal limits  CBC  I-STAT BETA HCG BLOOD, ED ([facility], [facility], [facility] ONLY)    EKG EKG Interpretation  Date/Time:  Monday February 26 2020 12:03:28 EDT Ventricular Rate:  85 PR Interval:  164 QRS Duration: 68 QT Interval:  380 QTC Calculation: 452 R Axis:   45 Text Interpretation: Sinus rhythm with occasional Premature ventricular complexes Cannot rule out Anterior infarct , age undetermined Abnormal ECG Confirmed by Virgina NorfolkAdam, Fatima Fedie [phone]([id number]) on 02/26/2020 7:50:56 PM   Radiology CT Head Wo Contrast  Result Date: 02/26/2020 CLINICAL DATA:  Neuro deficit, subacute. Additional provided: Possible stroke, high blood pressure, slurred speech EXAM: CT HEAD WITHOUT CONTRAST TECHNIQUE: Contiguous axial images were obtained from the base of the skull through the vertex without intravenous contrast. COMPARISON:  No pertinent prior studies available for comparison. FINDINGS: Brain: Cerebral volume is normal for age. There is no acute intracranial hemorrhage. No demarcated cortical infarct. No extra-axial fluid collection. No evidence of intracranial mass. No midline shift. Vascular: No hyperdense vessel Skull: Normal. Negative for fracture or focal lesion. Sinuses/Orbits: Visualized orbits show no acute finding. No significant paranasal sinus disease or mastoid effusion at the imaged levels. IMPRESSION: Unremarkable non-contrast CT appearance of the brain. No evidence of acute intracranial abnormality. Electronically Signed   By: Jackey LogeKyle  Golden DO   On: 02/26/2020 12:59    Procedures Procedures (including critical care time)  Medications Ordered in ED Medications  sodium chloride flush (NS) 0.9 % injection 3 mL (has no administration in time range)    LORazepam (ATIVAN) injection 1 mg (1 mg Intravenous Given 02/26/20 2055)  gadobutrol (GADAVIST) 1 MMOL/ML injection 8 mL (8 mLs Intravenous Contrast Given 02/26/20 2249)    ED Course  I have reviewed the triage vital signs and the nursing notes.  Pertinent labs & imaging results that were available during my care of the patient were reviewed by me and considered in my medical decision making (see chart for details).    MDM Rules/Calculators/A&P                          Merdis Delayameka Talbot is a 39 year old female with no significant medical history presents to the ED with speech issue.  Patient with unremarkable vitals.  No fever.  Sent by primary care to be evaluated for stroke.  States that she has had stuttering speech for the last several days.  History of the same about a year ago that self resolved.  Under a lot of stress.  Neurologically otherwise she is intact.  No history of MS in the family.  Overall she appears well but slightly anxious.  CT scan of the head was unremarkable.  We will get an MRI with and without contrast after talking with neurology to just rule out any other causes of symptoms.  Otherwise lab work is unremarkable.  Will give Ativan and reevaluate.  MRI pending at time of handoff. Please see Dr. Clayborne DanaMesner note for further results, eval, dispo. Suspect if MRI normal, likely stress/functional. No urinary symptoms, will send culture.  This chart was dictated using voice recognition software.  Despite best efforts to proofread,  errors can occur which can change the documentation meaning.    Final Clinical Impression(s) / ED Diagnoses Final diagnoses:  Speech disturbance,  unspecified type    Rx / DC Orders ED Discharge Orders    None       Virgina Norfolk, DO 02/26/20 2258    Virgina Norfolk, DO 02/26/20 2308

## 2020-02-26 NOTE — ED Notes (Signed)
Patient verbalizes understanding of discharge instructions. Opportunity for questioning and answers were provided. Armband removed by staff, pt discharged from ED and ambulated to lobby to return home.   

## 2020-02-28 DIAGNOSIS — F419 Anxiety disorder, unspecified: Secondary | ICD-10-CM | POA: Diagnosis not present

## 2020-02-28 DIAGNOSIS — R479 Unspecified speech disturbances: Secondary | ICD-10-CM | POA: Diagnosis not present

## 2020-02-28 DIAGNOSIS — F8081 Childhood onset fluency disorder: Secondary | ICD-10-CM | POA: Diagnosis not present

## 2020-02-28 DIAGNOSIS — I10 Essential (primary) hypertension: Secondary | ICD-10-CM | POA: Diagnosis not present

## 2020-02-28 LAB — URINE CULTURE

## 2020-03-01 DIAGNOSIS — Z3042 Encounter for surveillance of injectable contraceptive: Secondary | ICD-10-CM | POA: Diagnosis not present

## 2020-03-14 DIAGNOSIS — B349 Viral infection, unspecified: Secondary | ICD-10-CM | POA: Diagnosis not present

## 2020-03-14 DIAGNOSIS — Z20822 Contact with and (suspected) exposure to covid-19: Secondary | ICD-10-CM | POA: Diagnosis not present

## 2020-03-28 DIAGNOSIS — I10 Essential (primary) hypertension: Secondary | ICD-10-CM | POA: Diagnosis not present

## 2020-04-02 DIAGNOSIS — F411 Generalized anxiety disorder: Secondary | ICD-10-CM | POA: Diagnosis not present

## 2020-04-02 DIAGNOSIS — F331 Major depressive disorder, recurrent, moderate: Secondary | ICD-10-CM | POA: Diagnosis not present

## 2020-04-05 DIAGNOSIS — Z1231 Encounter for screening mammogram for malignant neoplasm of breast: Secondary | ICD-10-CM | POA: Diagnosis not present

## 2020-04-30 DIAGNOSIS — F331 Major depressive disorder, recurrent, moderate: Secondary | ICD-10-CM | POA: Diagnosis not present

## 2020-04-30 DIAGNOSIS — F411 Generalized anxiety disorder: Secondary | ICD-10-CM | POA: Diagnosis not present

## 2020-05-12 DIAGNOSIS — I10 Essential (primary) hypertension: Secondary | ICD-10-CM | POA: Diagnosis not present

## 2020-05-12 DIAGNOSIS — I639 Cerebral infarction, unspecified: Secondary | ICD-10-CM | POA: Diagnosis not present

## 2020-05-12 DIAGNOSIS — Z79899 Other long term (current) drug therapy: Secondary | ICD-10-CM | POA: Diagnosis not present

## 2020-05-12 DIAGNOSIS — N390 Urinary tract infection, site not specified: Secondary | ICD-10-CM | POA: Diagnosis not present

## 2020-05-12 DIAGNOSIS — R471 Dysarthria and anarthria: Secondary | ICD-10-CM | POA: Diagnosis not present

## 2020-05-12 DIAGNOSIS — Z791 Long term (current) use of non-steroidal anti-inflammatories (NSAID): Secondary | ICD-10-CM | POA: Diagnosis not present

## 2020-05-12 DIAGNOSIS — M542 Cervicalgia: Secondary | ICD-10-CM | POA: Diagnosis not present

## 2020-05-12 DIAGNOSIS — F8081 Childhood onset fluency disorder: Secondary | ICD-10-CM | POA: Diagnosis not present

## 2020-05-12 DIAGNOSIS — R519 Headache, unspecified: Secondary | ICD-10-CM | POA: Diagnosis not present

## 2020-05-12 DIAGNOSIS — R4781 Slurred speech: Secondary | ICD-10-CM | POA: Diagnosis not present

## 2020-05-12 DIAGNOSIS — Z8249 Family history of ischemic heart disease and other diseases of the circulatory system: Secondary | ICD-10-CM | POA: Diagnosis not present

## 2020-05-13 DIAGNOSIS — R4781 Slurred speech: Secondary | ICD-10-CM | POA: Diagnosis not present

## 2020-05-13 DIAGNOSIS — I10 Essential (primary) hypertension: Secondary | ICD-10-CM | POA: Diagnosis not present

## 2020-05-13 DIAGNOSIS — F419 Anxiety disorder, unspecified: Secondary | ICD-10-CM | POA: Diagnosis not present

## 2020-05-13 DIAGNOSIS — R519 Headache, unspecified: Secondary | ICD-10-CM | POA: Diagnosis not present

## 2020-08-11 IMAGING — US US LOW EXT VEINS LEFT
1 series · 14 of 16 positions shown · non-contrast
Comparison: none

EXAM: LEFT LOWER EXTREMITY VENOUS DOPPLER EXAM:
CLINICAL INDICATION:  left calf pain
TECHNIQUE: Grayscale and color Doppler imaging was utilized to evaluate the deep venous structures of the left lower extremity

[Series 1: us low ext veins left · 14 of 35 slices shown]
[im 1/35]
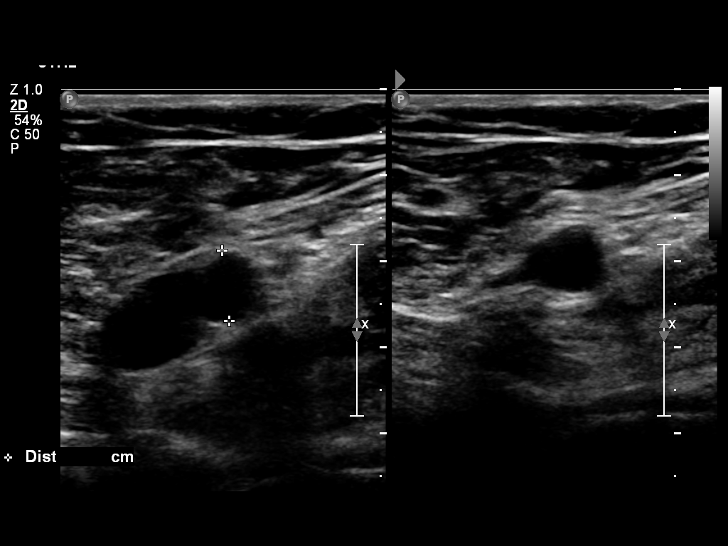
[im 3/35]
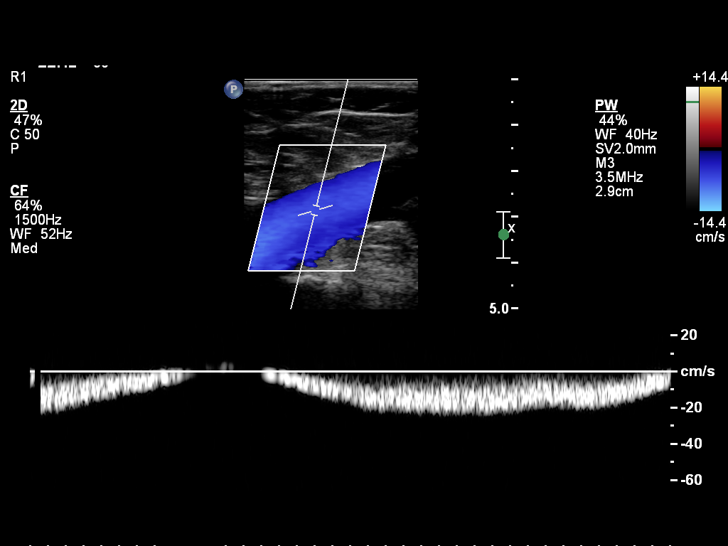
[im 5/35]
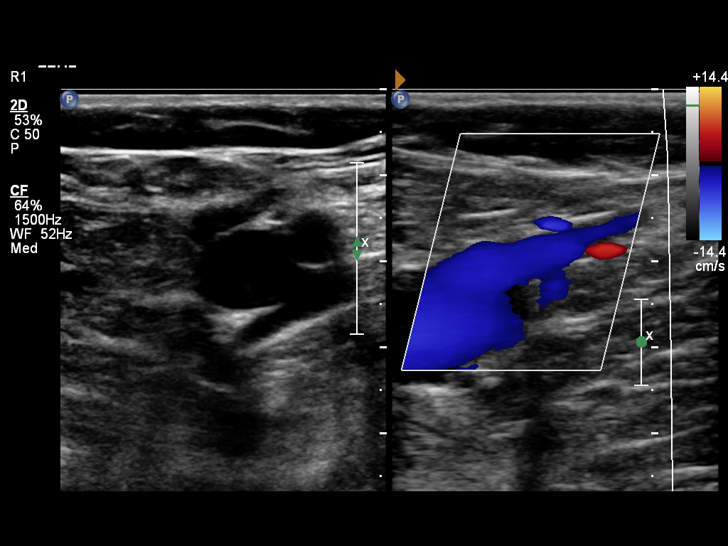
[im 10/35]
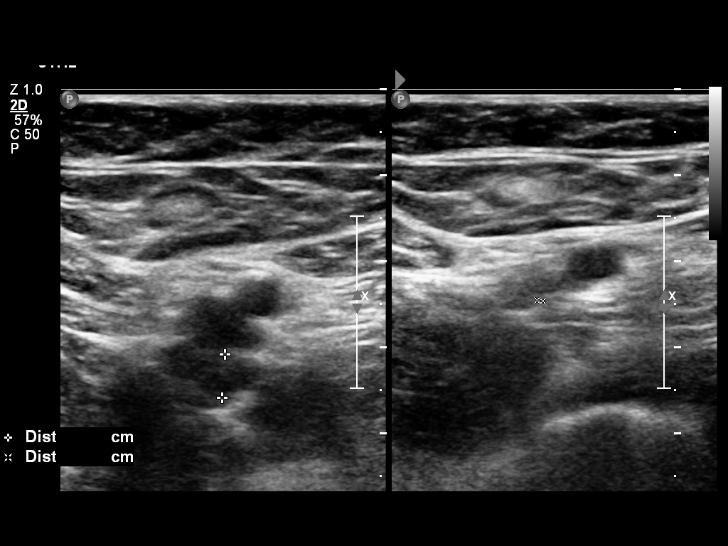
[im 12/35]
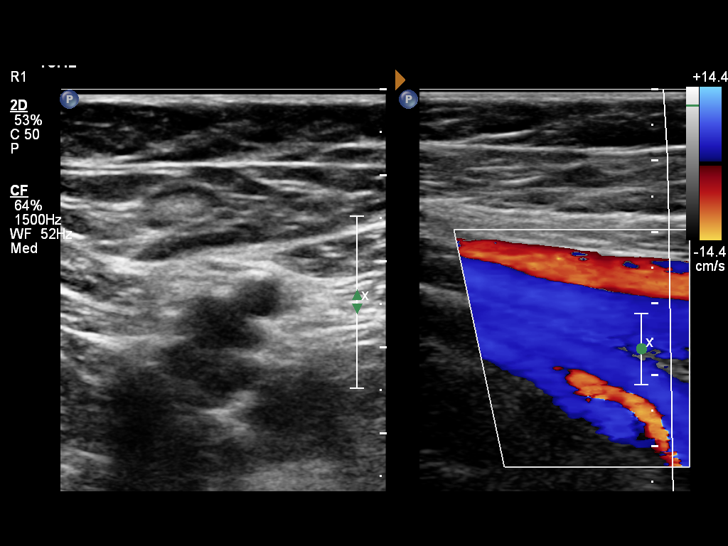
[im 14/35]
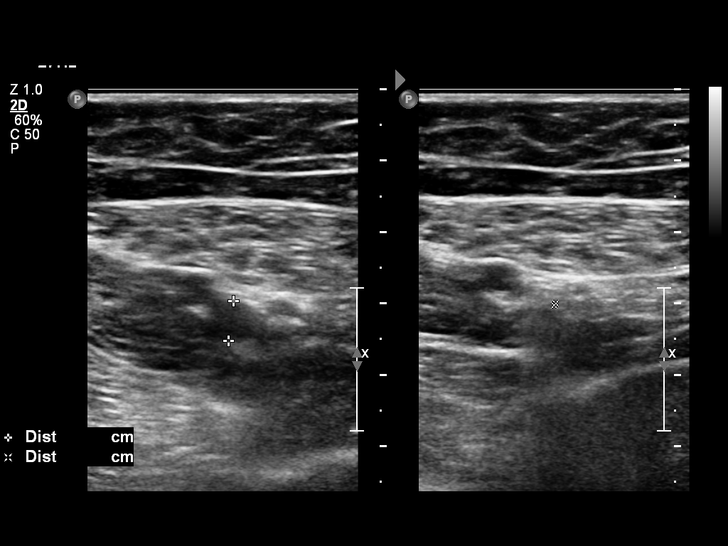
[im 16/35]
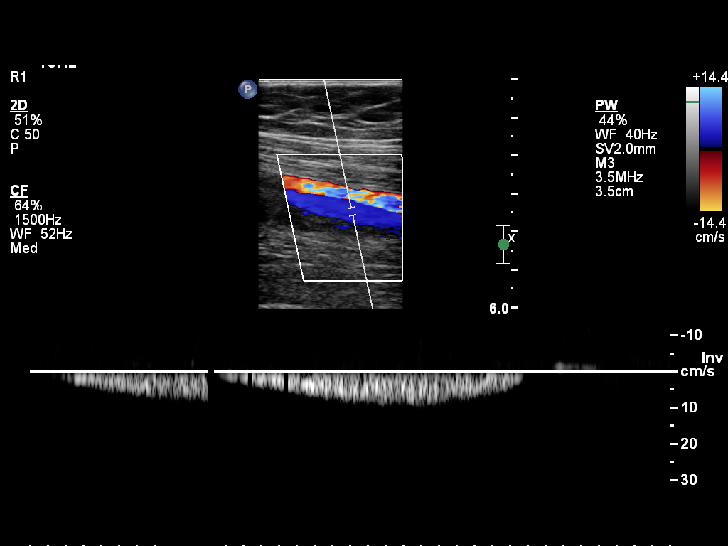
[im 19/35]
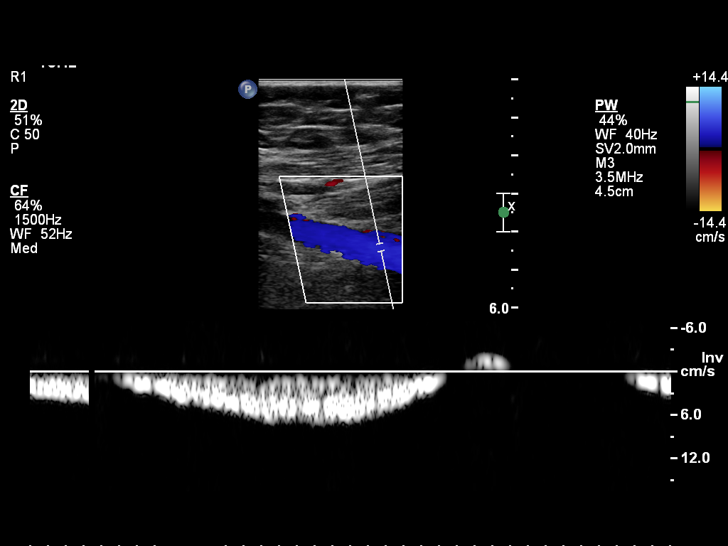
[im 21/35]
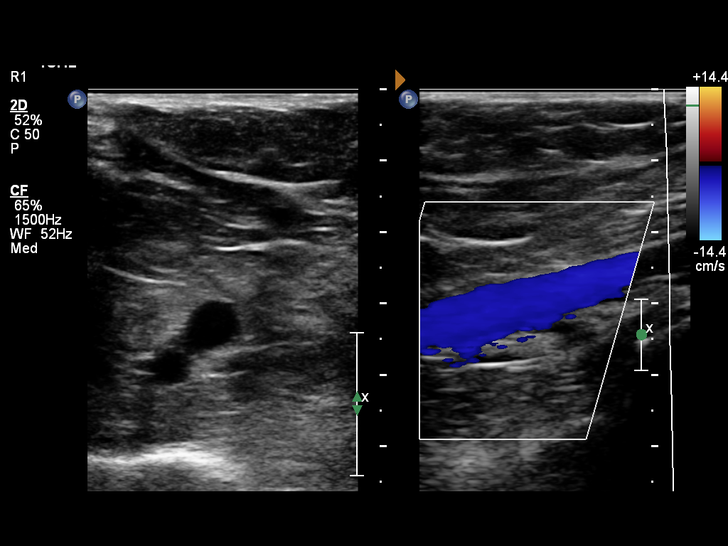
[im 23/35]
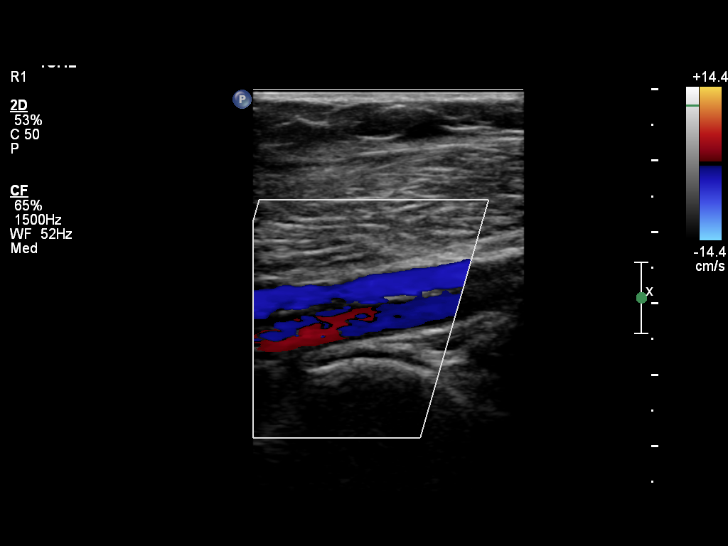
[im 28/35]
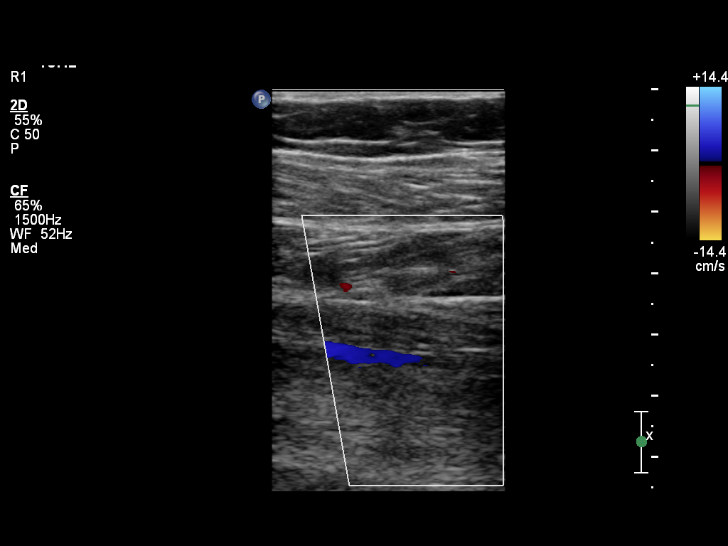
[im 30/35]
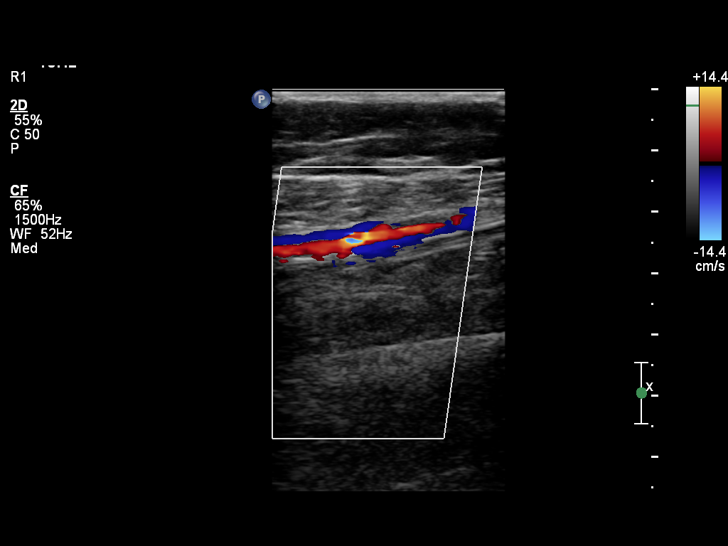
[im 32/35]
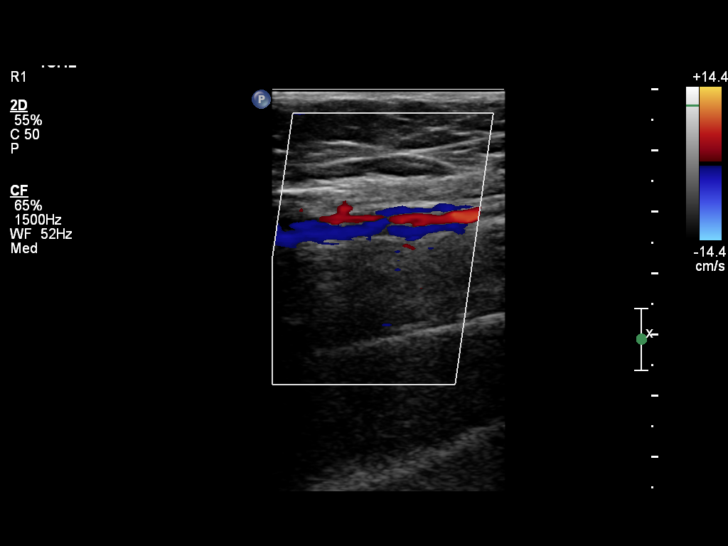
[im 35/35]
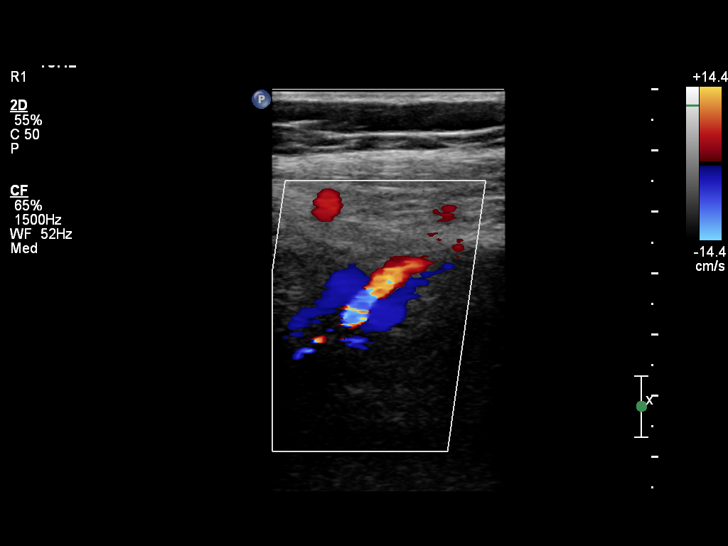

[14 of 16 positions shown; findings below may reference images not displayed]

FINDINGS: All deep venous structures demonstrate normal compressibility, waveforms, augmentation, and flow.
IMPRESSION: No deep venous thrombosis identified.
LOCATION CODE : 1
Is the patient pregnant?
No

## 2020-10-08 IMAGING — MR MRI LUMBAR SPINE WO CONTRAST
4 of 7 series · 25 of 48 positions shown · non-contrast
Comparison: No priors

EXAM:  MRI LUMBAR SPINE WITHOUT CONTRAST
INDICATION: 39-year-old female with low back pain and lower extremity radiculopathy.
TECHNIQUE: Multiplanar imaging of the lumbar spine without contrast.

[Series 501: T1 · sagittal · 4.0mm · 0.49mm/px · 6 of 13 slices shown (1 of 2)]
[im 1/13]
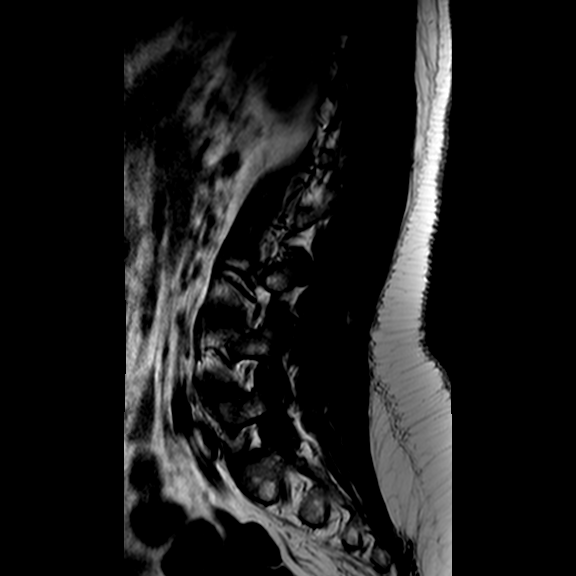
[im 3/13]
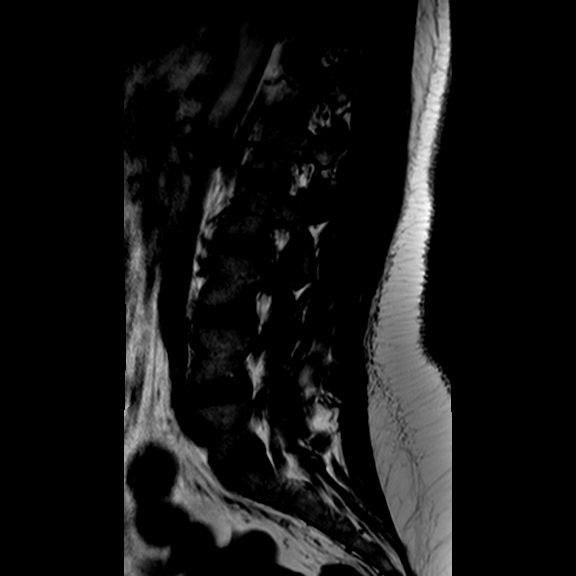
[im 5/13]
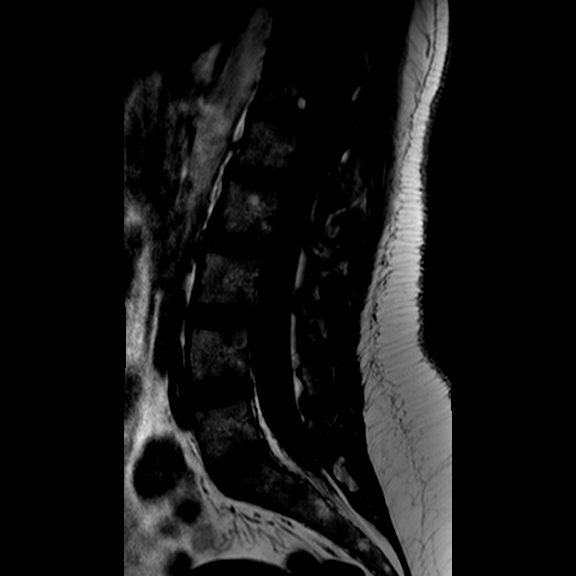
[im 8/13]
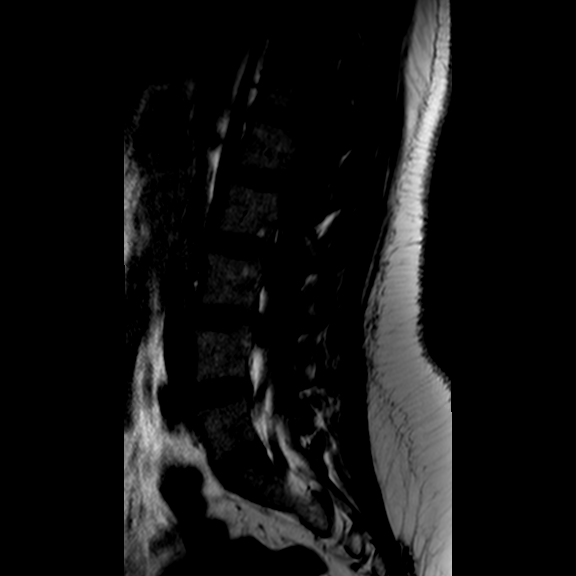
[im 10/13]
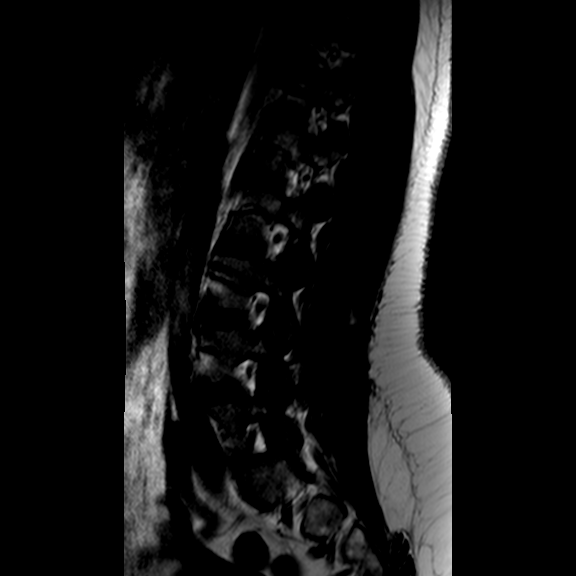
[im 13/13]
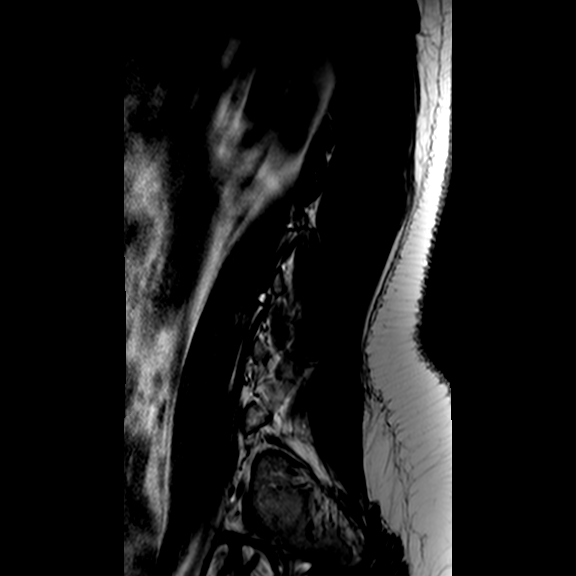

[Series 601: T2 · sagittal · 4.0mm · 0.49mm/px · 5 of 13 slices shown (1 of 2)]
[im 1/13]
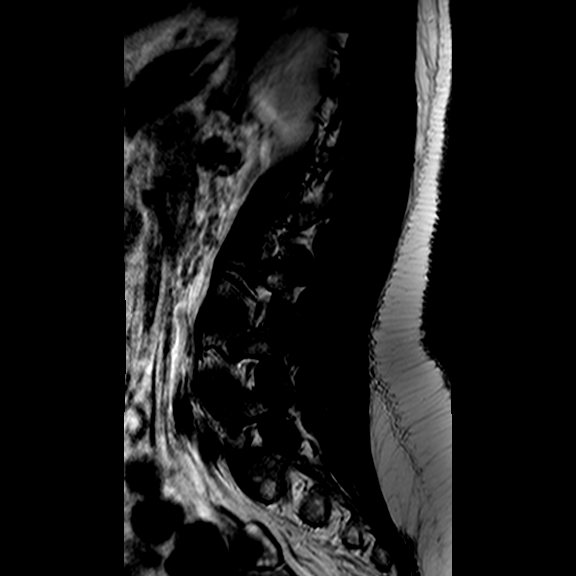
[im 4/13]
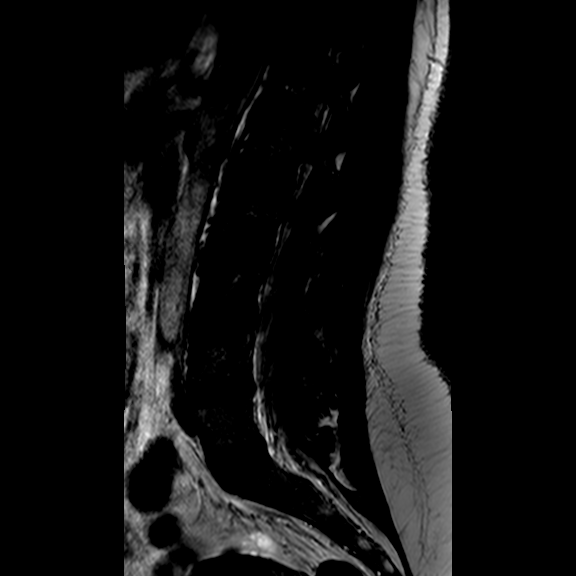
[im 7/13]
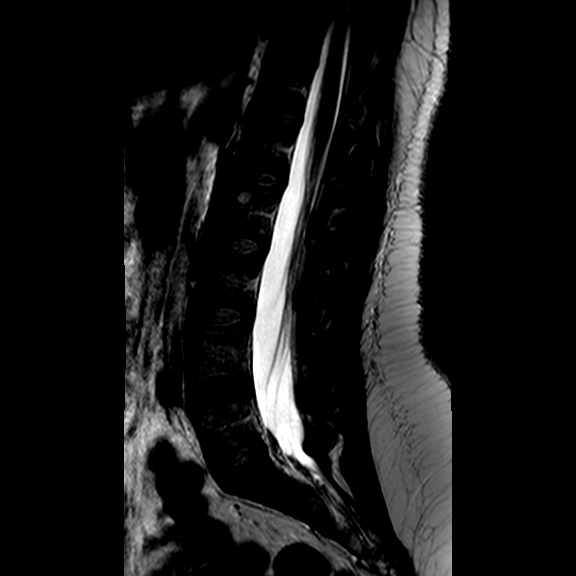
[im 10/13]
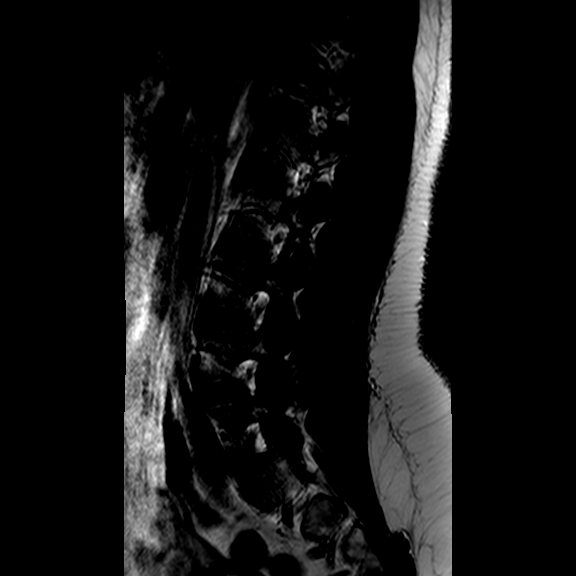
[im 13/13]
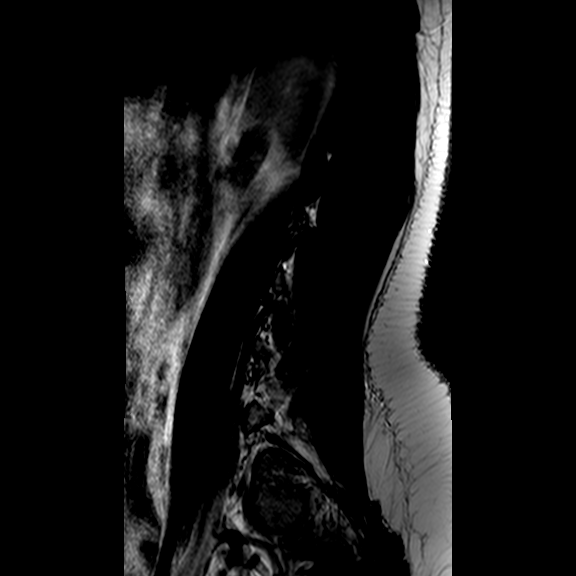

[Series 801: T2 · axial · 4.0mm · 0.49mm/px · z∈[-93,+90]mm · 11 of 28 slices shown (2 of 2)]
[im 1/28]
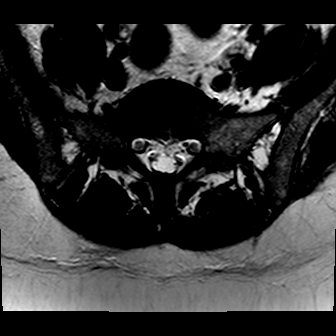
[im 3/28]
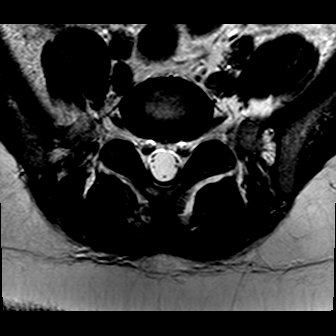
[im 6/28]
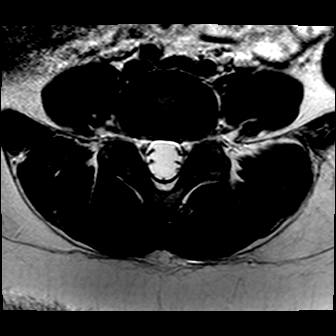
[im 9/28]
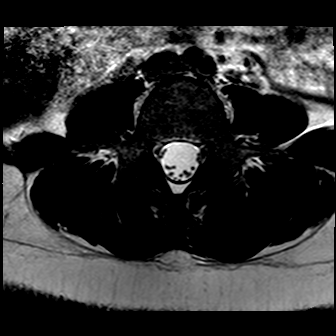
[im 11/28]
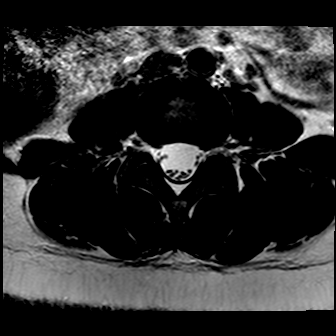
[im 14/28]
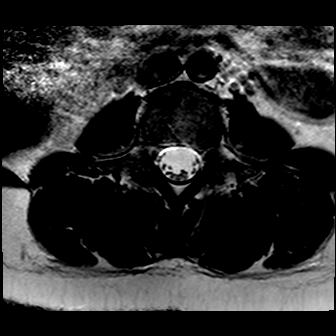
[im 17/28]
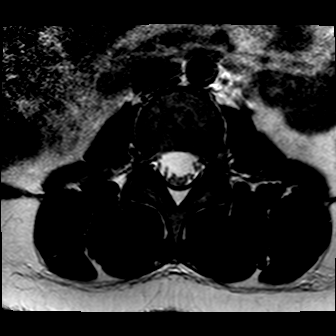
[im 19/28]
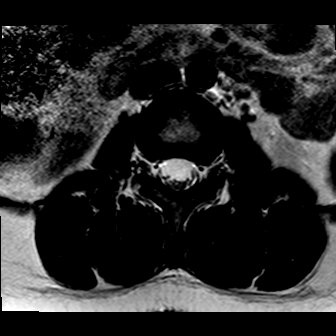
[im 22/28]
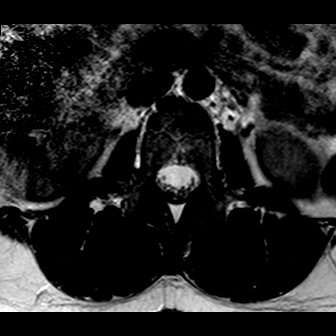
[im 25/28]
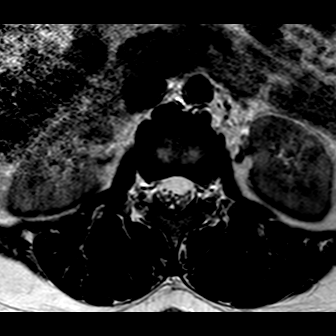
[im 28/28]
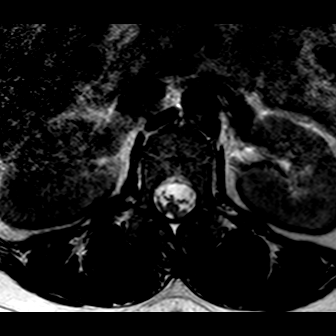

[Series 901: T1 · axial · 4.0mm · 0.49mm/px · z∈[-84,+75]mm · 3 of 28 slices shown (2 of 2)]
[im 3/28]
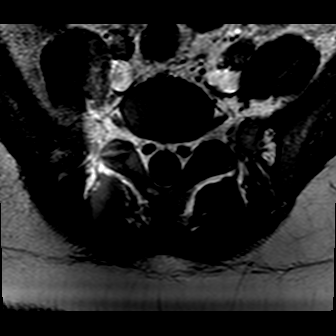
[im 14/28]
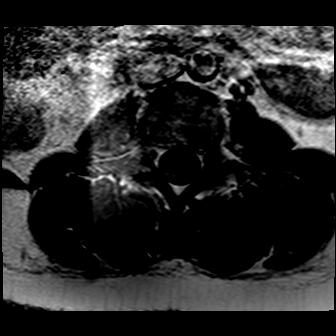
[im 25/28]
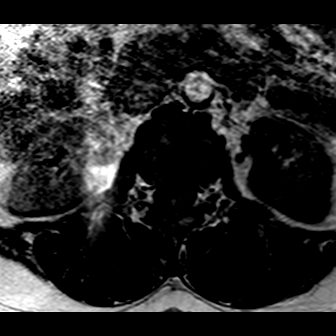

[25 of 48 positions shown; findings below may reference images not displayed]

FINDINGS: ALIGNMENT:  Lowest well-formed disk space referred to as L5-S1. Appropriate lumbar lordosis without significant listhesis.
VERTEBRAL COLUMN:  Marrow signal is within normal limits. Vertebral bodies are well formed without acute fracture, signal abnormality or mass lesion. Desiccation of the L4-5 intervertebral disc. Disk height is otherwise appropriate and signal appears age appropriate.
CONUS/CAUDA EQUINA: No mass, signal abnormality or intradural lesions visualized.
L1-2: No disk herniation or stenosis. No significant facet arthropathy. Neural foramina are patent.
L2-3:  No disk herniation or stenosis. No significant facet arthropathy. Neural foramina are patent.
L3-4:  No disk herniation or stenosis. Mild bilateral degenerative facet arthropathy. Neural foramina are patent.
L4-5:  No disk herniation or stenosis. Mild bilateral degenerative facet arthropathy. Neural foramina are patent.
L5-S1:  No disk herniation or stenosis. Moderate bilateral degenerative significant facet arthropathy. Neural foramina are patent.
IMPRESSION: No disc herniation or canal stenosis.
Is the patient pregnant?
No

## 2020-12-10 IMAGING — CT CT HEAD WITHOUT CONTRAST
3 of 4 series · 16 of 47 positions shown, 19 images · IV contrast (agent unspecified)
Comparison: none

FINAL REPORT:
CT OF THE HEAD WITHOUT CONTRAST:
CLINICAL INDICATION: Acute post-traumatic headache, intractable
REFERENCE: None
TECHNIQUE: Multiple axial images were obtained through the brain without the administration of contrast.

[Series 201: head w/o, idose (1) · axial · non-contrast · 0.38mm/px · z∈[+961,+1093]mm · 10 of 32 slices shown, 13 images]
[im 3/32  brain]
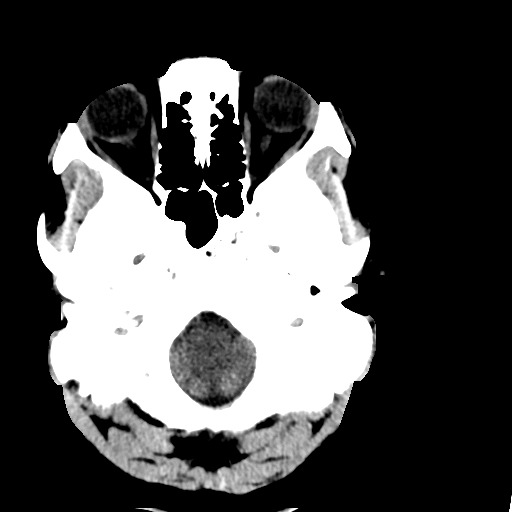
[im 3/32  bone]
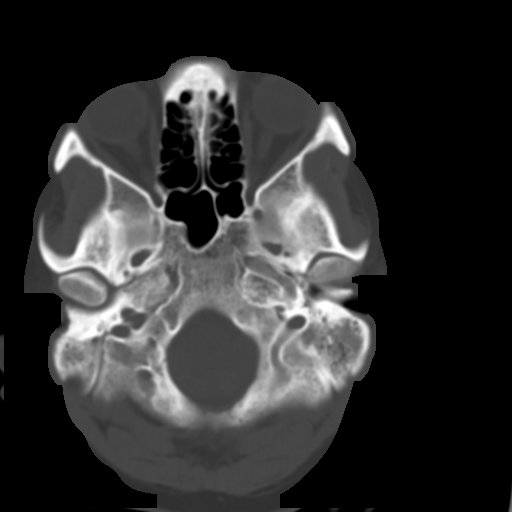
[im 5/32  brain]
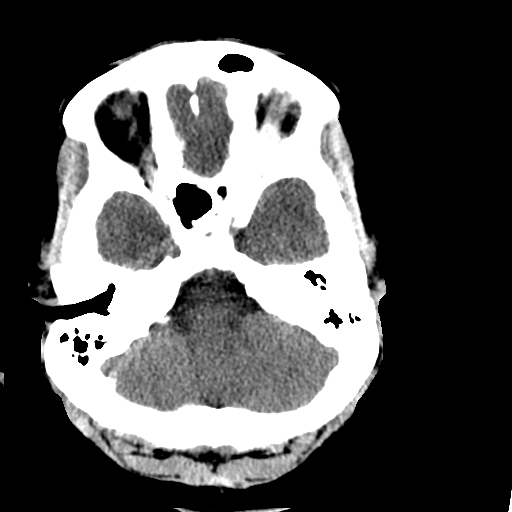
[im 9/32  brain]
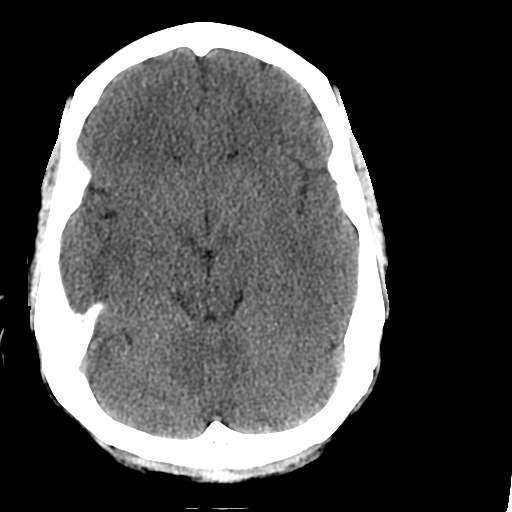
[im 12/32  brain]
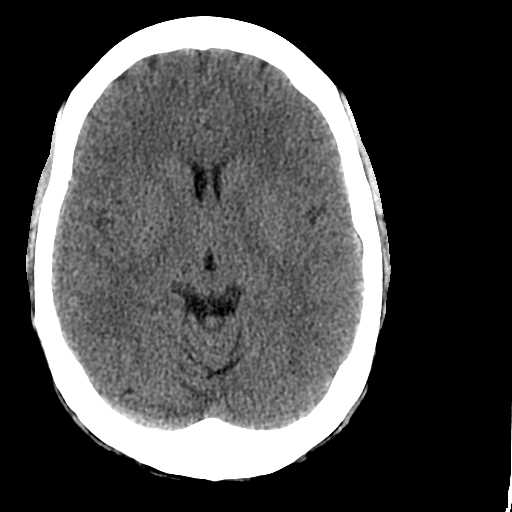
[im 14/32  brain]
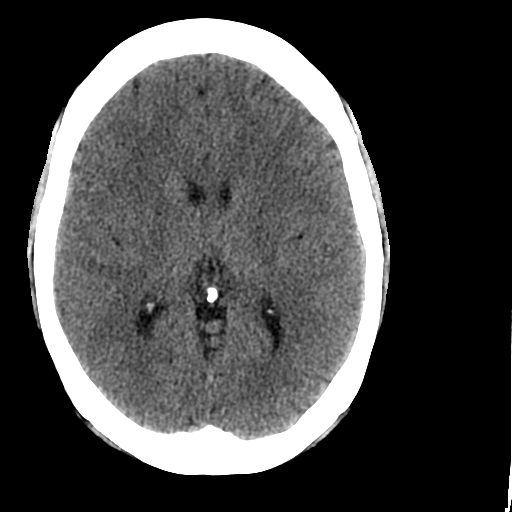
[im 14/32  bone]
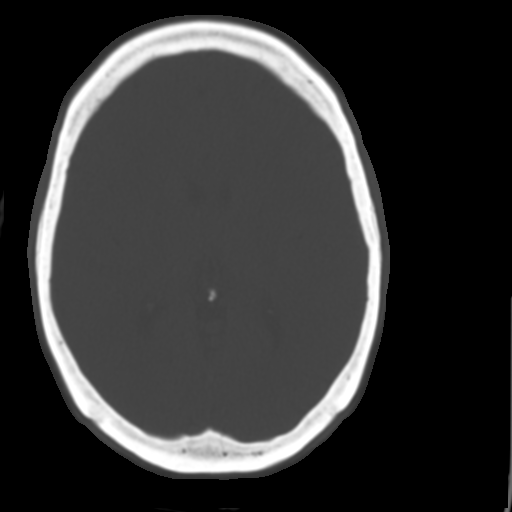
[im 18/32  brain]
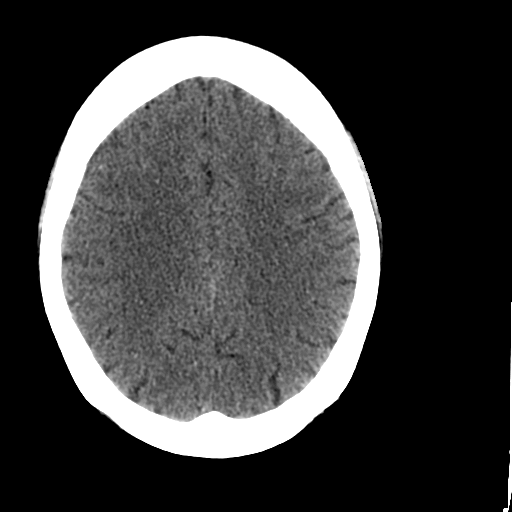
[im 20/32  brain]
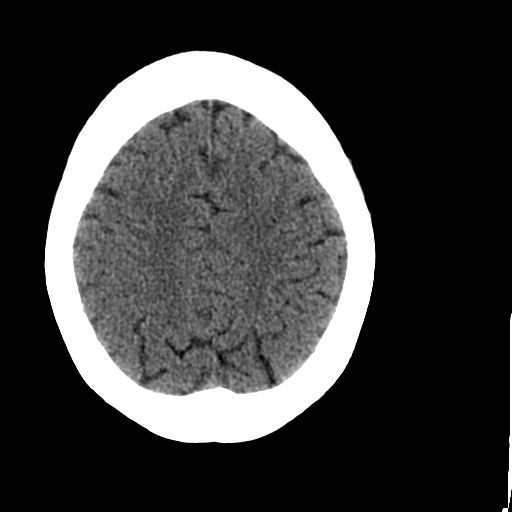
[im 23/32  brain]
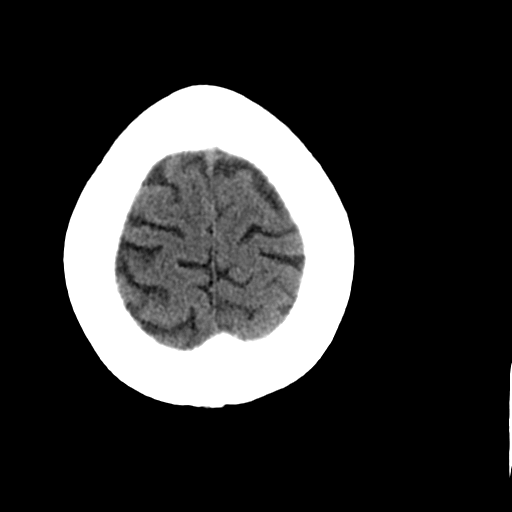
[im 27/32  brain]
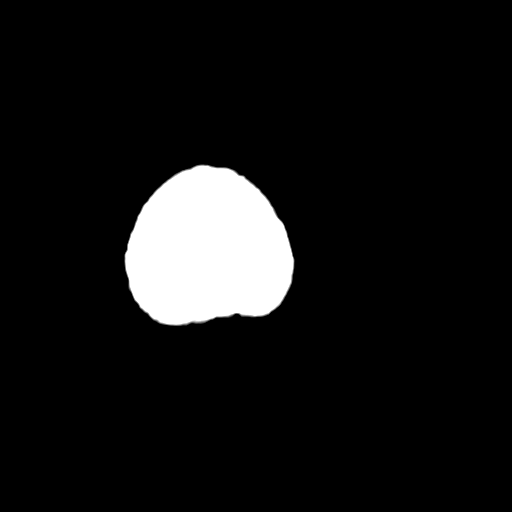
[im 27/32  bone]
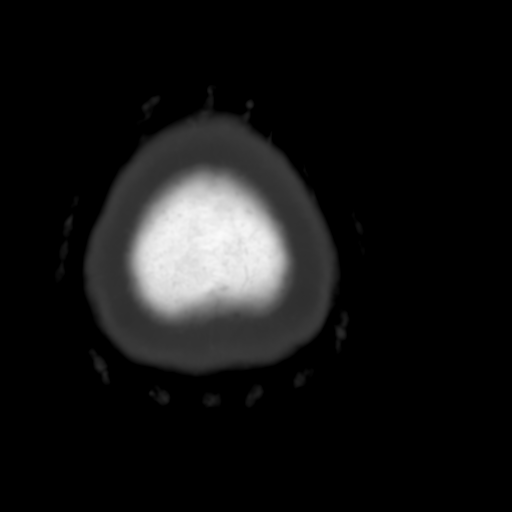
[im 29/32  brain]
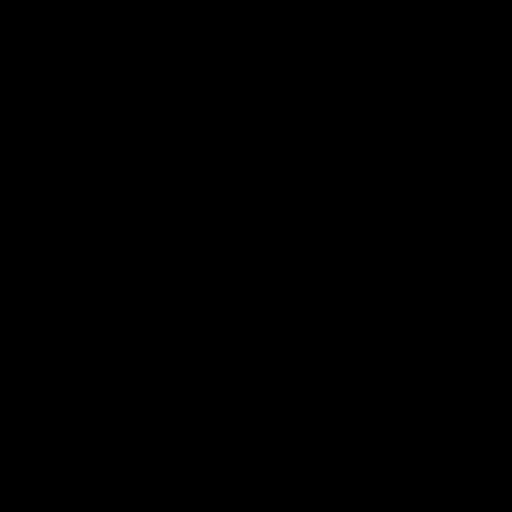

[Series 203: cor, idose (1) · coronal · 0.38mm/px · 3 of 77 slices shown]
[im 26/77  brain]
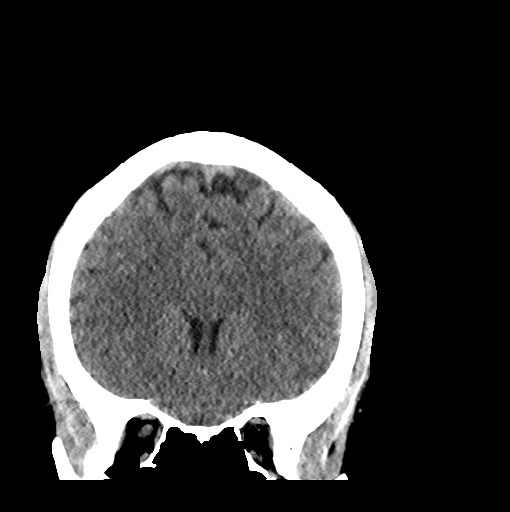
[im 34/77  brain]
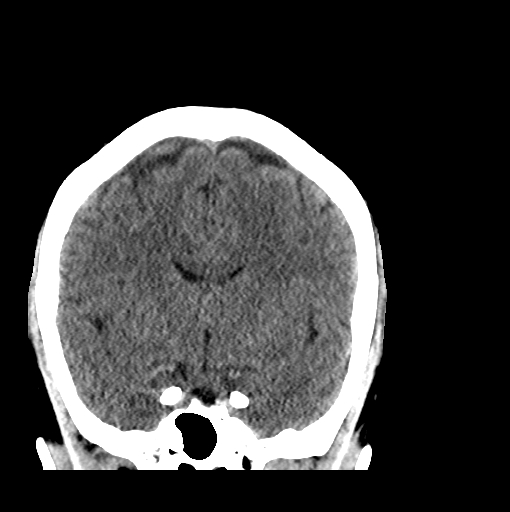
[im 43/77  brain]
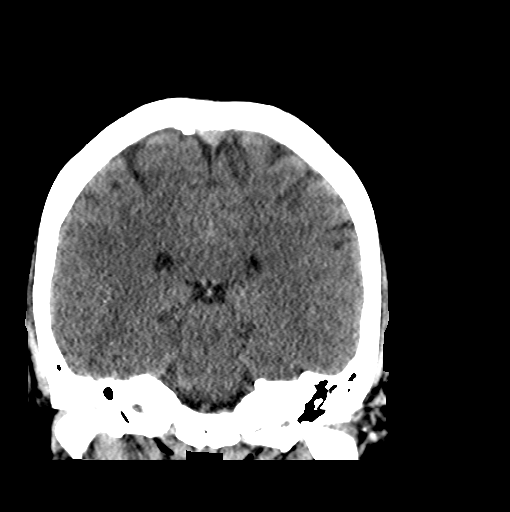

[Series 204: sag, idose (1) · sagittal · 0.39mm/px · 3 of 98 slices shown]
[im 33/98  brain]
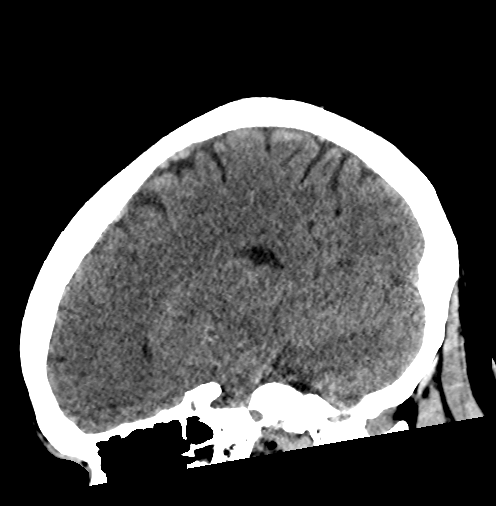
[im 49/98  brain]
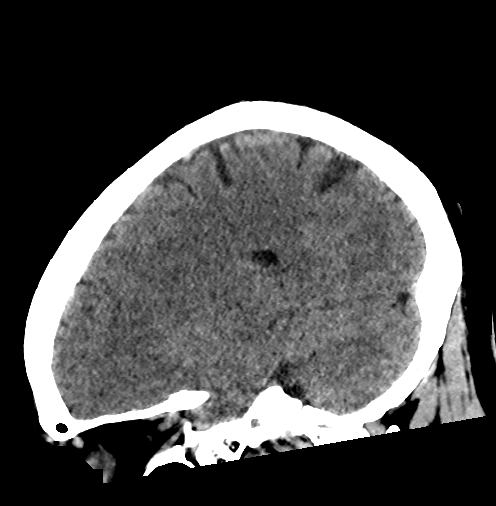
[im 65/98  brain]
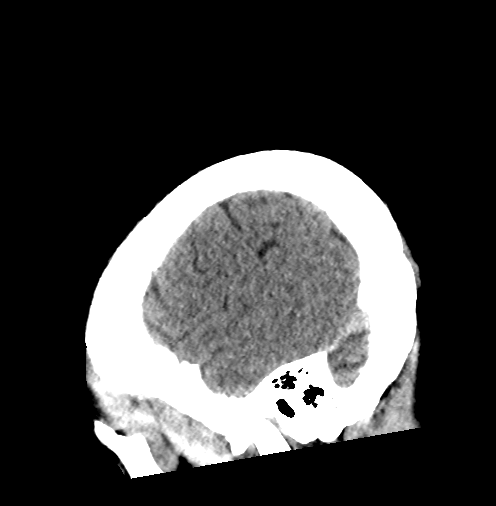

[16 of 47 positions shown; findings below may reference images not displayed]

FINDINGS: Intracranially, no hemorrhage or mass-effect is identified.
Cerebral volume appears normal.
The ventricles are normal in size and midline in position.
Normal gray-white differentiation is preserved throughout.
Extracranially, the sinuses and mastoids are well aerated.  The calvarium is intact.  Surrounding soft tissue structures are unremarkable.
IMPRESSION: No acute intracranial process identified.
All CT scans at this facility use dose modulation, iterative reconstruction, and/or weight-based dosing when appropriate to reduce radiation dose to as low as reasonably achievable.
LOCATION CODE: 1
Is the patient pregnant?
Unknown

## 2021-01-17 IMAGING — CT CT HEAD WITHOUT CONTRAST
3 series · 16 of 47 positions shown, 19 images · non-contrast
Comparison: 12/10/2020

FINAL REPORT:
INDICATION: Mental status change, persistent or worsening
Speech changes Pain
Exam: CT of the head without IV Contrast
All CT scans at this facility use iterative reconstruction technique, dose modulation and/or weight based dosing when appropriate to reduce radiation dose to as low as reasonably achievable.

[Series 2: head stnd · axial · 0.49mm/px · z∈[-15,+116]mm · 10 of 32 slices shown, 13 images]
[im 3/32  brain]
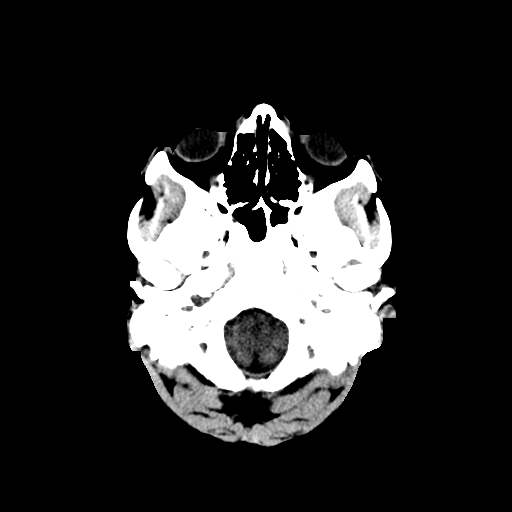
[im 3/32  bone]
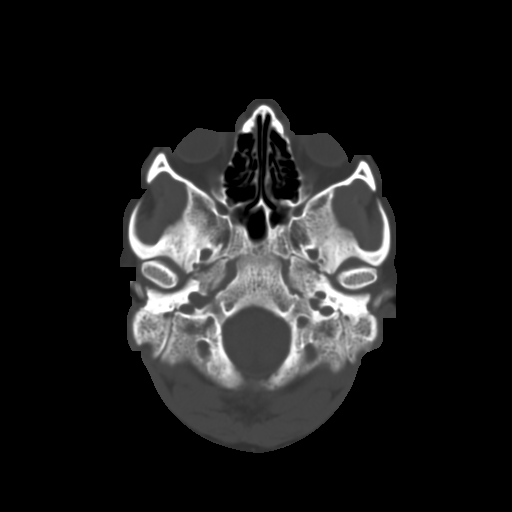
[im 6/32  brain]
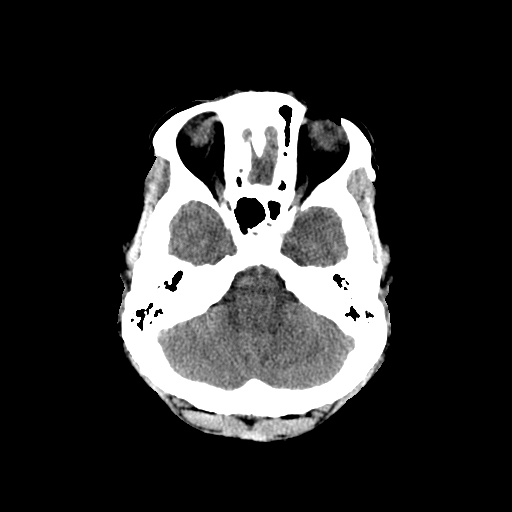
[im 9/32  brain]
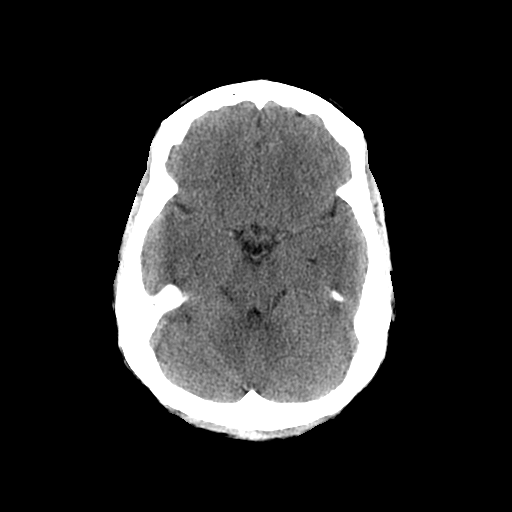
[im 11/32  brain]
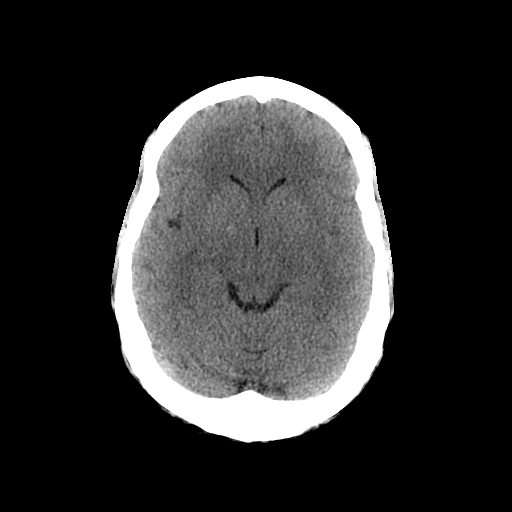
[im 14/32  brain]
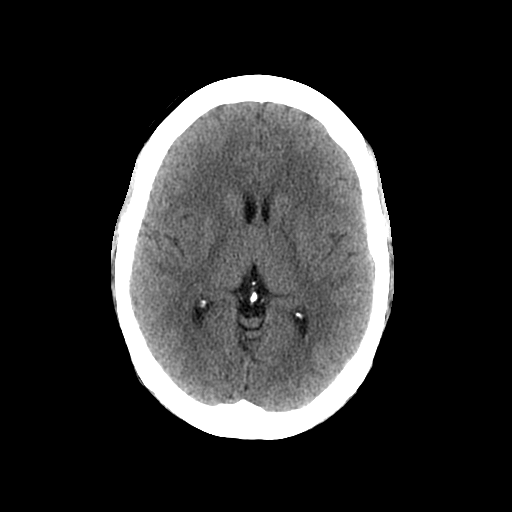
[im 14/32  bone]
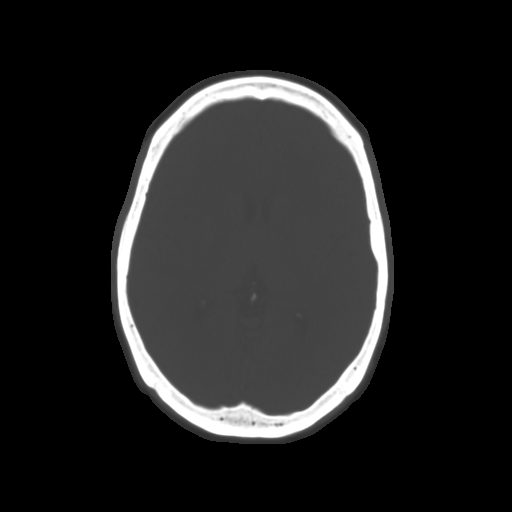
[im 18/32  brain]
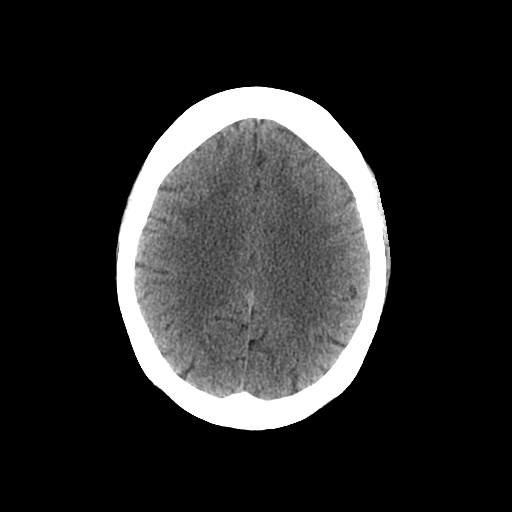
[im 21/32  brain]
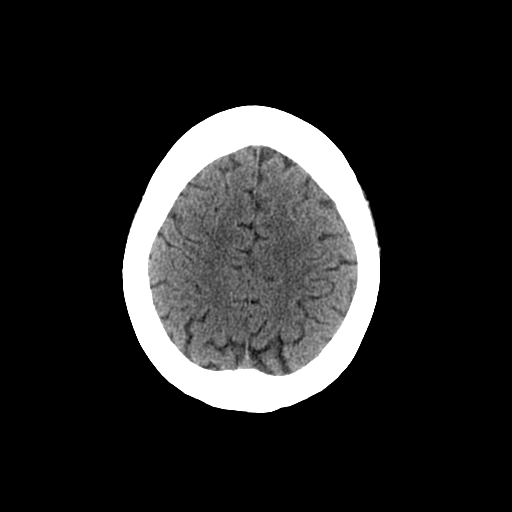
[im 24/32  brain]
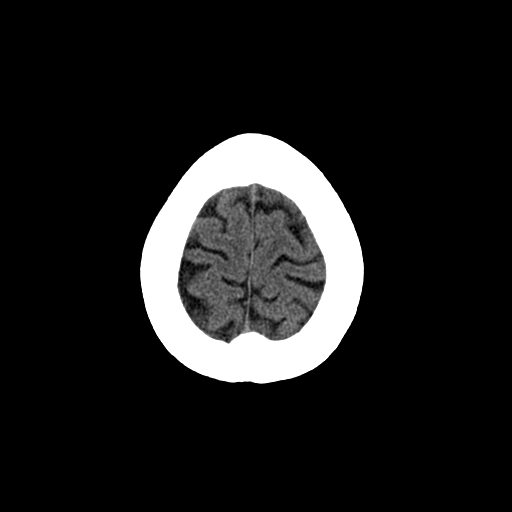
[im 26/32  brain]
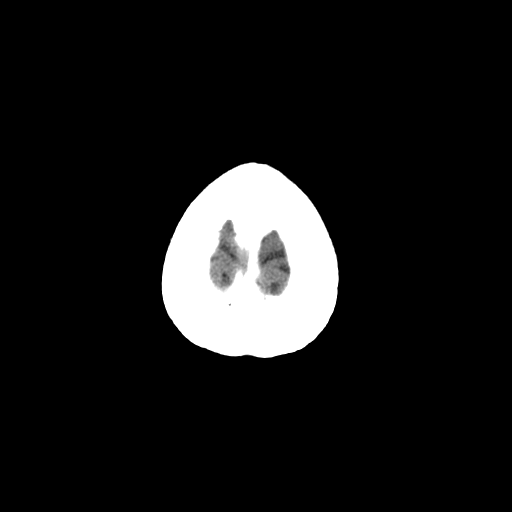
[im 26/32  bone]
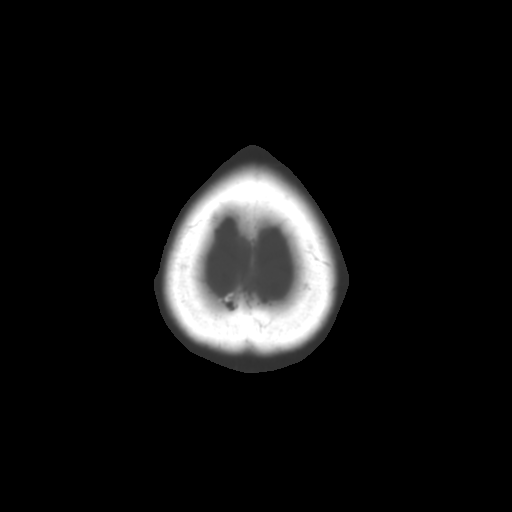
[im 29/32  brain]
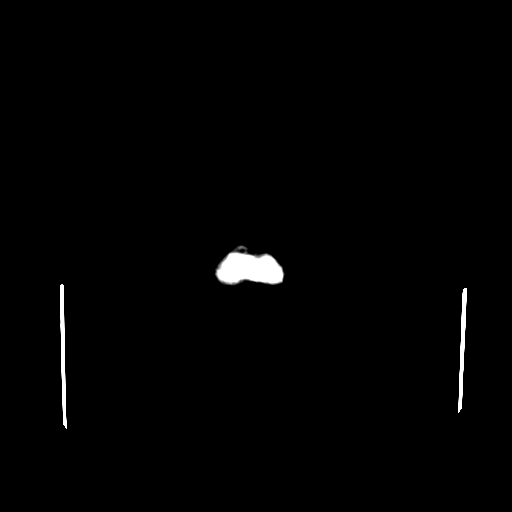

[Series 601: cor head · coronal · 0.49mm/px · 3 of 99 slices shown]
[im 33/99  brain]
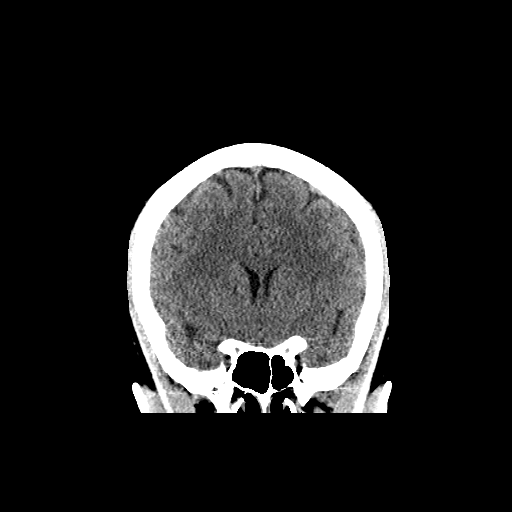
[im 44/99  brain]
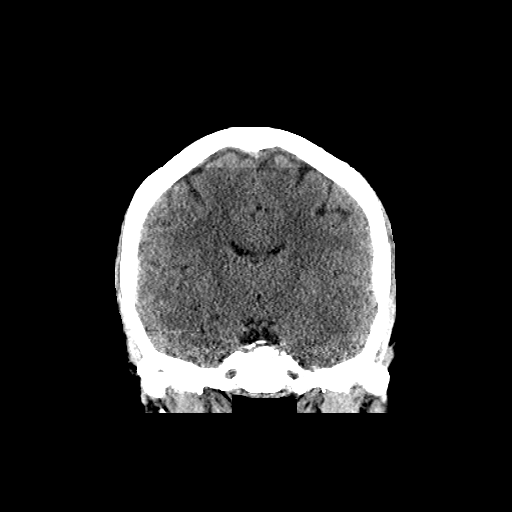
[im 55/99  brain]
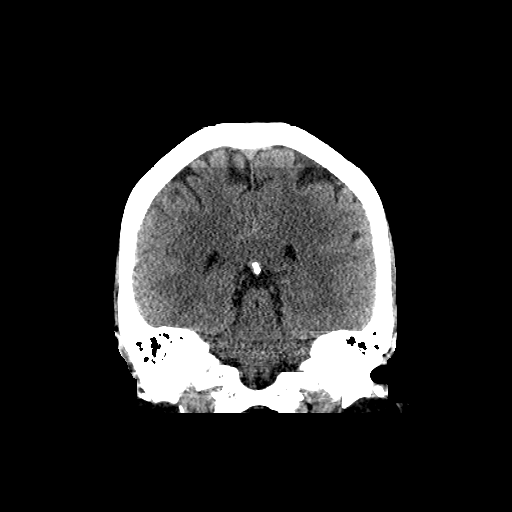

[Series 602: sag head · sagittal · 0.45mm/px · 3 of 85 slices shown]
[im 29/85  brain]
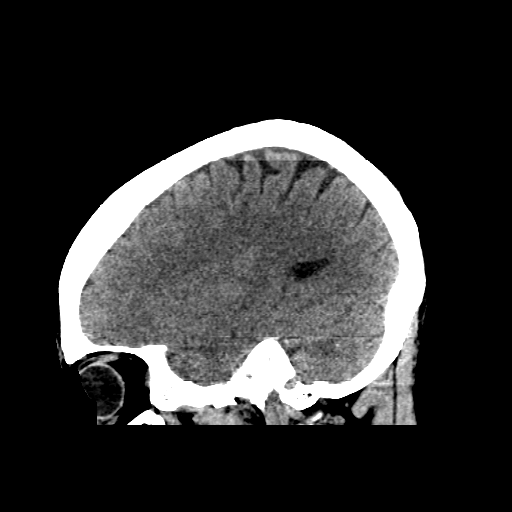
[im 43/85  brain]
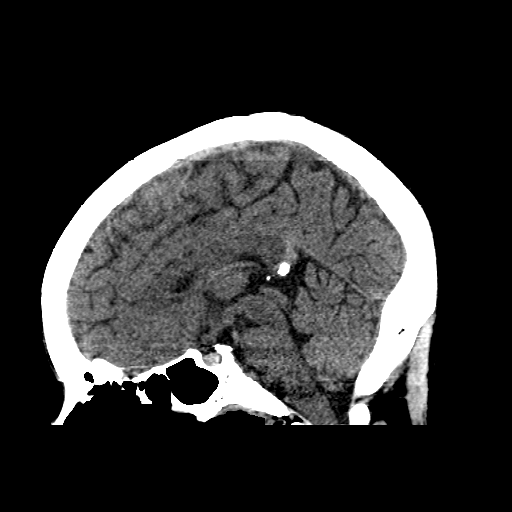
[im 57/85  brain]
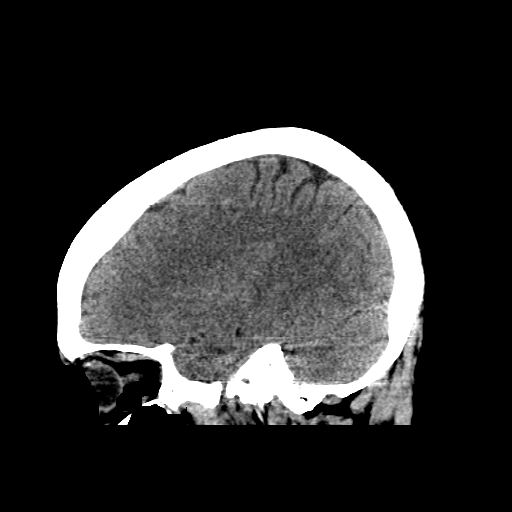

[16 of 47 positions shown; findings below may reference images not displayed]

FINDINGS: Cerebral parenchyma: Unremarkable
Intracranial hemorrhage: None.
Ventricular system: The ventricles are not significantly dilated or effaced.
Basal cisterns: Patent
Vascular system: Normal.
Extra-axial spaces: Unremarkable
Paranasal sinuses and mastoid air cells: Clear.
Visualized soft tissues and globes: Unremarkable.
Calvarium: Normal.
IMPRESSION: 
IMPRESSION: 1. No acute intracranial abnormality.
Is the patient pregnant?
Waiting for Lab Result
